# Patient Record
Sex: Female | Born: 2000 | Hispanic: No | Marital: Single
Health system: Southern US, Community
[De-identification: ages and names within clinical notes are randomized; demographics above are authoritative.]

## PROBLEM LIST (undated history)

## (undated) DIAGNOSIS — J45909 Unspecified asthma, uncomplicated: Secondary | ICD-10-CM

---

## 2020-09-24 ENCOUNTER — Ambulatory Visit: Admission: EM | Admit: 2020-09-24 | Discharge: 2020-09-24 | Discharge status: Against Medical Advice | Payer: Self-pay

## 2020-09-24 ENCOUNTER — Emergency Department (HOSPITAL_COMMUNITY): Payer: Medicaid Other

## 2020-09-24 ENCOUNTER — Other Ambulatory Visit: Payer: Self-pay

## 2020-09-24 ENCOUNTER — Emergency Department (HOSPITAL_COMMUNITY)
Admission: EM | Admit: 2020-09-24 | Discharge: 2020-09-24 | Disposition: A | Discharge status: Home / Self Care | Payer: Medicaid Other | Attending: Emergency Medicine | Admitting: Emergency Medicine

## 2020-09-24 ENCOUNTER — Encounter (HOSPITAL_COMMUNITY): Payer: Self-pay | Admitting: Emergency Medicine

## 2020-09-24 DIAGNOSIS — R0789 Other chest pain: Secondary | ICD-10-CM | POA: Insufficient documentation

## 2020-09-24 DIAGNOSIS — J45909 Unspecified asthma, uncomplicated: Secondary | ICD-10-CM | POA: Diagnosis not present

## 2020-09-24 DIAGNOSIS — R059 Cough, unspecified: Secondary | ICD-10-CM | POA: Diagnosis not present

## 2020-09-24 DIAGNOSIS — Z20822 Contact with and (suspected) exposure to covid-19: Secondary | ICD-10-CM | POA: Insufficient documentation

## 2020-09-24 DIAGNOSIS — R509 Fever, unspecified: Secondary | ICD-10-CM | POA: Insufficient documentation

## 2020-09-24 DIAGNOSIS — R000 Tachycardia, unspecified: Secondary | ICD-10-CM | POA: Insufficient documentation

## 2020-09-24 LAB — BASIC METABOLIC PANEL
Anion gap: 12 (ref 5–15)
BUN: 8 mg/dL (ref 6–20)
CO2: 23 mmol/L (ref 22–32)
Calcium: 9.3 mg/dL (ref 8.9–10.3)
Chloride: 102 mmol/L (ref 98–111)
Creatinine, Ser: 0.61 mg/dL (ref 0.44–1.00)
GFR, Estimated: >60 mL/min (ref >60)
Glucose, Bld: 90 mg/dL (ref 70–99)
Potassium: 3.8 mmol/L (ref 3.5–5.1)
Sodium: 137 mmol/L (ref 135–145)

## 2020-09-24 LAB — CBC WITH DIFFERENTIAL/PLATELET
Abs Immature Granulocytes: 0.04 10*3/uL (ref 0.00–0.07)
Basophils Absolute: 0 10*3/uL (ref 0.0–0.1)
Basophils Relative: 0 %
Eosinophils Absolute: 0 10*3/uL (ref 0.0–0.5)
Eosinophils Relative: 0 %
HCT: 42.6 % (ref 36.0–46.0)
Hemoglobin: 13.7 g/dL (ref 12.0–15.0)
Immature Granulocytes: 1 %
Lymphocytes Relative: 19 %
Lymphs Abs: 1.3 10*3/uL (ref 0.7–4.0)
MCH: 30.6 pg (ref 26.0–34.0)
MCHC: 32.2 g/dL (ref 30.0–36.0)
MCV: 95.3 fL (ref 80.0–100.0)
Monocytes Absolute: 0.8 10*3/uL (ref 0.1–1.0)
Monocytes Relative: 12 %
Neutro Abs: 4.7 10*3/uL (ref 1.7–7.7)
Neutrophils Relative %: 68 %
Platelets: 175 10*3/uL (ref 150–400)
RBC: 4.47 MIL/uL (ref 3.87–5.11)
RDW: 14.4 % (ref 11.5–15.5)
WBC: 6.9 10*3/uL (ref 4.0–10.5)
nRBC: 0 % (ref 0.0–0.2)

## 2020-09-24 LAB — D-DIMER, QUANTITATIVE: D-Dimer, Quant: 0.4 ug/mL-FEU (ref 0.00–0.50)

## 2020-09-24 LAB — SARS CORONAVIRUS 2 (TAT 6-24 HRS): SARS Coronavirus 2: NEGATIVE

## 2020-09-24 MED ORDER — ACETAMINOPHEN 325 MG PO TABS
650.0000 mg | ORAL_TABLET | Freq: Once | ORAL | Status: AC
  Administered 2020-09-24: 650 mg via ORAL
  Filled 2020-09-24: qty 2

## 2020-09-24 MED ORDER — BENZONATATE 100 MG PO CAPS
100.0000 mg | ORAL_CAPSULE | Freq: Once | ORAL | Status: AC
  Administered 2020-09-24: 100 mg via ORAL
  Filled 2020-09-24: qty 1

## 2020-09-24 MED ORDER — BENZONATATE 100 MG PO CAPS: 100.0000 mg | ORAL_CAPSULE | Freq: Three times a day (TID) | ORAL | 0 refills | Status: DC | PRN

## 2020-09-24 MED ORDER — IBUPROFEN 800 MG PO TABS
800.0000 mg | ORAL_TABLET | Freq: Once | ORAL | Status: AC
  Administered 2020-09-24: 800 mg via ORAL
  Filled 2020-09-24: qty 1

## 2020-09-24 MED ORDER — ALBUTEROL SULFATE HFA 108 (90 BASE) MCG/ACT IN AERS: 1.0000 | INHALATION_SPRAY | Freq: Four times a day (QID) | RESPIRATORY_TRACT | 0 refills | Status: DC | PRN

## 2020-09-24 NOTE — ED Provider Notes (Signed)
[facility] EMERGENCY DEPARTMENT Provider Note   CSN: [id number] Arrival date & time: 09/24/20  1059     History Chief Complaint  Patient presents with  . Cough  . Chest Pain  . Generalized Body Aches    Karla Torres is a 20 y.o. female.  The history is provided by the patient and medical records.  Cough Associated symptoms: chest pain   Chest Pain Associated symptoms: cough    Karla Torres is a 20 y.o. female who presents to the Emergency Department complaining of cough.  She reports cough and chest discomfort since Thursday.  Cough is productive of mucus.  Had a small amount of hemoptysis today.  Had a fever to 102 today. No vomiting/diarrhea.  Has a hx/o asthma.  Had a sick contact last week - not sure if they had covid or flu.  Has IUD.  No leg swelling/pain.  No hx/o DVT/PE. Symptoms are moderate and constant nature.    History reviewed. No pertinent past medical history.  There are no problems to display for this patient.   History reviewed. No pertinent surgical history.   OB History   No obstetric history on file.     No family history on file.  Social History   Tobacco Use  . Smokeless tobacco: Never Used  Substance Use Topics  . Drug use: Yes    Types: Marijuana    Home Medications Prior to Admission medications   Medication Sig Start Date End Date Taking? Authorizing Provider  albuterol (VENTOLIN HFA) 108 (90 Base) MCG/ACT inhaler Inhale 1-2 puffs into the lungs every 6 (six) hours as needed for wheezing or shortness of breath. 09/24/20  Yes Tilden Fossa, MD  benzonatate (TESSALON) 100 MG capsule Take 1 capsule (100 mg total) by mouth 3 (three) times daily as needed for cough. 09/24/20  Yes Tilden Fossa, MD  levonorgestrel [facility]) 19.5 MG IUD 19.5 mg by Intrauterine route once. Placed: 7/14/21Removal date: 12/22/24 Lot: [id number] date: 04/2022   Yes [provider]    Allergies    Amoxicillin  Review of  Systems   Review of Systems  Respiratory: Positive for cough.   Cardiovascular: Positive for chest pain.  All other systems reviewed and are negative.   Physical Exam Updated Vital Signs BP 99/68 (BP Location: Right Arm)   Pulse 87   Temp 98.4 F (36.9 C) (Axillary)   Resp (!) 21   LMP 09/24/2020   SpO2 95%   Physical Exam Vitals and nursing note reviewed.  Constitutional:      Appearance: She is well-developed.  HENT:     Head: Normocephalic and atraumatic.  Cardiovascular:     Rate and Rhythm: Regular rhythm. Tachycardia present.     Heart sounds: No murmur heard.   Pulmonary:     Effort: Pulmonary effort is normal. No respiratory distress.     Breath sounds: Normal breath sounds.  Abdominal:     Palpations: Abdomen is soft.     Tenderness: There is no abdominal tenderness. There is no guarding or rebound.  Musculoskeletal:        General: No swelling or tenderness.  Skin:    General: Skin is warm and dry.  Neurological:     Mental Status: She is alert and oriented to person, place, and time.  Psychiatric:        Behavior: Behavior normal.     ED Results / Procedures / Treatments   Labs (all labs ordered are listed, but only  abnormal results are displayed) Labs Reviewed  SARS CORONAVIRUS 2 (TAT 6-24 HRS)  CBC WITH DIFFERENTIAL/PLATELET  BASIC METABOLIC PANEL  D-DIMER, QUANTITATIVE  POC URINE PREG, ED    EKG EKG Interpretation  Date/Time:  Saturday September 24 2020 11:29:32 EDT Ventricular Rate:  97 PR Interval:  140 QRS Duration: 82 QT Interval:  336 QTC Calculation: 426 R Axis:   68 Text Interpretation: Normal sinus rhythm Normal ECG Confirmed by Tilden Fossa [phone]) on 09/24/2020 1:19:29 PM   Radiology DG Chest 2 View  Result Date: 09/24/2020 CLINICAL DATA:  Cough,cp,body aches EXAM: CHEST - 2 VIEW COMPARISON:  None. FINDINGS: The cardiomediastinal contours are within normal limits. The lungs are clear. No pneumothorax or pleural effusion. No  acute finding in the visualized skeleton. IMPRESSION: No acute cardiopulmonary process. Electronically Signed   By: Emmaline Kluver M.D.   On: 09/24/2020 15:23    Procedures Procedures   Medications Ordered in ED Medications  acetaminophen (TYLENOL) tablet 650 mg (650 mg Oral Given 09/24/20 1428)  benzonatate (TESSALON) capsule 100 mg (100 mg Oral Given 09/24/20 1428)  ibuprofen (ADVIL) tablet 800 mg (800 mg Oral Given 09/24/20 1712)    ED Course  I have reviewed the triage vital signs and the nursing notes.  Pertinent labs & imaging results that were available during my care of the patient were reviewed by me and considered in my medical decision making (see chart for details).    MDM Rules/Calculators/A&P                         patient here for evaluation of fever, cough, chest discomfort. She is non-toxic appearing on evaluation. Presentation is not consistent with pneumonia, sepsis, serious bacterial infection. Presentation is not consistent with PE, D dimer is negative. Discussed with patient likely viral illness, possible COVID-19 versus influenza versus separate upper respiratory infection. Discussed home care with Tylenol and ibuprofen for fever control as well as antitussives. Discussed outpatient follow-up and return precautions.  Final Clinical Impression(s) / ED Diagnoses Final diagnoses:  Suspected COVID-19 virus infection    Rx / DC Orders ED Discharge Orders         Ordered    benzonatate (TESSALON) 100 MG capsule  3 times daily PRN        09/24/20 1647    albuterol (VENTOLIN HFA) 108 (90 Base) MCG/ACT inhaler  Every 6 hours PRN        09/24/20 1647           Tilden Fossa, MD 09/24/20 1754

## 2020-09-24 NOTE — ED Notes (Signed)
Patient transported to X-ray 

## 2020-09-24 NOTE — ED Triage Notes (Signed)
Pt. Stated, I was exposed to someone who has COVID  And I started having symptoms on Thursday. Cough, chest pressure, generalized aches.

## 2020-11-16 ENCOUNTER — Other Ambulatory Visit: Payer: Self-pay

## 2020-11-16 ENCOUNTER — Emergency Department (HOSPITAL_COMMUNITY)
Admission: EM | Admit: 2020-11-16 | Discharge: 2020-11-16 | Disposition: A | Discharge status: Home / Self Care | Payer: Medicaid Other | Attending: Emergency Medicine | Admitting: Emergency Medicine

## 2020-11-16 DIAGNOSIS — R300 Dysuria: Secondary | ICD-10-CM | POA: Diagnosis not present

## 2020-11-16 DIAGNOSIS — Z113 Encounter for screening for infections with a predominantly sexual mode of transmission: Secondary | ICD-10-CM | POA: Insufficient documentation

## 2020-11-16 DIAGNOSIS — N938 Other specified abnormal uterine and vaginal bleeding: Secondary | ICD-10-CM | POA: Diagnosis present

## 2020-11-16 DIAGNOSIS — Z202 Contact with and (suspected) exposure to infections with a predominantly sexual mode of transmission: Secondary | ICD-10-CM

## 2020-11-16 LAB — URINALYSIS, ROUTINE W REFLEX MICROSCOPIC
Bilirubin Urine: NEGATIVE
Glucose, UA: NEGATIVE mg/dL
Hgb urine dipstick: NEGATIVE
Ketones, ur: 5 mg/dL — AB
Leukocytes,Ua: NEGATIVE
Nitrite: NEGATIVE
Protein, ur: NEGATIVE mg/dL
Specific Gravity, Urine: 1.026 (ref 1.005–1.030)
pH: 6 (ref 5.0–8.0)

## 2020-11-16 LAB — PREGNANCY, URINE: Preg Test, Ur: NEGATIVE

## 2020-11-16 NOTE — Discharge Instructions (Addendum)
You results should be available on myChart in the next 2-3 days.  We will contact you if you have a positive test result and call in an antibiotic for you.

## 2020-11-16 NOTE — ED Provider Notes (Signed)
[facility]-EMERGENCY DEPT Provider Note   CSN: [id number] Arrival date & time: 11/16/20  1920     History Chief Complaint  Patient presents with  . Exposure to STD    Karla Torres is a 20 y.o. female.  Patient presents to the emergency department with a chief complaint of requesting STD testing.  She states that she had some vaginal discharge and dysuria a couple weeks ago.  She reports being intermittently sexually active with multiple partners with intermittent barrier protection use.  She denies any abdominal pain or pelvic pain now.  Denies any discharge currently.  The history is provided by the patient. No language interpreter was used.       No past medical history on file.  There are no problems to display for this patient.   No past surgical history on file.   OB History   No obstetric history on file.     No family history on file.  Social History   Tobacco Use  . Smokeless tobacco: Never Used  Substance Use Topics  . Drug use: Yes    Types: Marijuana    Home Medications Prior to Admission medications   Medication Sig Start Date End Date Taking? Authorizing Provider  albuterol (VENTOLIN HFA) 108 (90 Base) MCG/ACT inhaler Inhale 1-2 puffs into the lungs every 6 (six) hours as needed for wheezing or shortness of breath. 09/24/20   Tilden Fossa, MD  benzonatate (TESSALON) 100 MG capsule Take 1 capsule (100 mg total) by mouth 3 (three) times daily as needed for cough. 09/24/20   Tilden Fossa, MD  levonorgestrel [facility]) 19.5 MG IUD 19.5 mg by Intrauterine route once. Placed: 7/14/21Removal date: 12/22/24 Lot: [id number] date: 04/2022    [provider]    Allergies    Amoxicillin  Review of Systems   Review of Systems  All other systems reviewed and are negative.   Physical Exam Updated Vital Signs BP 134/78   Pulse 70   Temp 98 F (36.7 C) (Oral)   Resp 18   Ht 5\' 3"  (1.6 m)   Wt 121.4 kg   SpO2 100%    BMI 47.41 kg/m   Physical Exam Vitals and nursing note reviewed.  Constitutional:      General: She is not in acute distress.    Appearance: She is well-developed.  HENT:     Head: Normocephalic and atraumatic.  Eyes:     Conjunctiva/sclera: Conjunctivae normal.  Cardiovascular:     Rate and Rhythm: Normal rate.     Heart sounds: No murmur heard.   Pulmonary:     Effort: Pulmonary effort is normal. No respiratory distress.  Abdominal:     General: There is no distension.  Musculoskeletal:     Cervical back: Neck supple.     Comments: Moves all extremities  Skin:    General: Skin is warm and dry.  Neurological:     Mental Status: She is alert and oriented to person, place, and time.  Psychiatric:        Mood and Affect: Mood normal.        Behavior: Behavior normal.     ED Results / Procedures / Treatments   Labs (all labs ordered are listed, but only abnormal results are displayed) Labs Reviewed  URINALYSIS, ROUTINE W REFLEX MICROSCOPIC - Abnormal; Notable for the following components:      Result Value   APPearance HAZY (*)    Ketones, ur 5 (*)  All other components within normal limits  URINE CULTURE  PREGNANCY, URINE    EKG None  Radiology No results found.  Procedures Procedures   Medications Ordered in ED Medications - No data to display  ED Course  I have reviewed the triage vital signs and the nursing notes.  Pertinent labs & imaging results that were available during my care of the patient were reviewed by me and considered in my medical decision making (see chart for details).    MDM Rules/Calculators/A&P                          Patient here with vaginal discharge from a couple of weeks ago.  Denies complaints now, but would like to have STD testing to be certain she doesn't having and infection.  She is non-toxic appearing.  No having any abdominal pain or back pain.  I doubt any serious infection or PID.  Preg test negative.  UA  unremarkable.  Patient agreeable with self swabbing for G/C testing and we will call her with any positive results.  Final Clinical Impression(s) / ED Diagnoses Final diagnoses:  Possible exposure to STD    Rx / DC Orders ED Discharge Orders    None       Roxy Horseman, PA-C 11/16/20 2256    Linwood Dibbles, MD 11/17/20 [id number]

## 2020-11-16 NOTE — ED Triage Notes (Signed)
Pt states she wants to be tested for STD's. Denies vaginal discharge, urinary symptoms or pelvic pain

## 2020-11-16 NOTE — ED Provider Notes (Signed)
Emergency Medicine Provider Triage Evaluation Note  Karla Torres , a 20 y.o. female  was evaluated in triage.  Pt complains of STD check and vaginal discharge.  Patient states 2 weeks ago she had burning with urination and vaginal discharge.  She admits to being sexually active with 4 partners, sometimes uses protection but not always.  Review of Systems  Positive: Vaginal discharge, dysuria Negative: Fever, chills, abdominal pain, nausea, emesis, back pain  Physical Exam  BP 134/78   Pulse 70   Temp 98 F (36.7 C) (Oral)   Resp 18   Ht 5\' 3"  (1.6 m)   Wt 121.4 kg   SpO2 100%   BMI 47.41 kg/m  Gen:   Awake, no distress   Resp:  Normal effort MSK:   Moves extremities without difficulty Other:  No abdominal tenderness.  Medical Decision Making  Medically screening exam initiated at 8:01 PM.  Appropriate orders placed.  Karla Torres was informed that the remainder of the evaluation will be completed by another provider, this initial triage assessment does not replace that evaluation, and the importance of remaining in the ED until their evaluation is complete.  Urinalysis and pregnancy test ordered.    Portions of this note were generated with Mel Almond. Dictation errors may occur despite best attempts at proofreading.    Scientist, clinical (histocompatibility and immunogenetics), PA-C 11/16/20 01/16/21, MD 11/17/20 [phone]

## 2020-11-18 LAB — GC/CHLAMYDIA PROBE AMP (~~LOC~~) NOT AT ARMC
Chlamydia: NEGATIVE
Comment: NEGATIVE
Comment: NORMAL
Neisseria Gonorrhea: NEGATIVE

## 2020-11-18 LAB — URINE CULTURE

## 2020-12-20 ENCOUNTER — Emergency Department (HOSPITAL_COMMUNITY)
Admission: EM | Admit: 2020-12-20 | Discharge: 2020-12-20 | Disposition: A | Discharge status: Home / Self Care | Payer: Medicaid Other | Attending: Emergency Medicine | Admitting: Emergency Medicine

## 2020-12-20 ENCOUNTER — Other Ambulatory Visit: Payer: Self-pay

## 2020-12-20 ENCOUNTER — Encounter (HOSPITAL_COMMUNITY): Payer: Self-pay | Admitting: Emergency Medicine

## 2020-12-20 DIAGNOSIS — R300 Dysuria: Secondary | ICD-10-CM | POA: Diagnosis present

## 2020-12-20 DIAGNOSIS — N898 Other specified noninflammatory disorders of vagina: Secondary | ICD-10-CM | POA: Insufficient documentation

## 2020-12-20 DIAGNOSIS — N390 Urinary tract infection, site not specified: Secondary | ICD-10-CM

## 2020-12-20 LAB — WET PREP, GENITAL
Clue Cells Wet Prep HPF POC: NONE SEEN
Sperm: NONE SEEN
Trich, Wet Prep: NONE SEEN
Yeast Wet Prep HPF POC: NONE SEEN

## 2020-12-20 LAB — URINALYSIS, ROUTINE W REFLEX MICROSCOPIC
Bilirubin Urine: NEGATIVE
Glucose, UA: NEGATIVE mg/dL
Ketones, ur: NEGATIVE mg/dL
Nitrite: NEGATIVE
Protein, ur: 30 mg/dL — AB
Specific Gravity, Urine: 1.016 (ref 1.005–1.030)
WBC, UA: >50 WBC/hpf — ABNORMAL HIGH (ref 0–5)
pH: 6 (ref 5.0–8.0)

## 2020-12-20 LAB — POC URINE PREG, ED: Preg Test, Ur: NEGATIVE

## 2020-12-20 MED ORDER — SULFAMETHOXAZOLE-TRIMETHOPRIM 800-160 MG PO TABS: 1.0000 | ORAL_TABLET | Freq: Two times a day (BID) | ORAL | 0 refills | Status: AC

## 2020-12-20 NOTE — Discharge Instructions (Addendum)
Overall it appears that you have a urinary tract infection.  Take antibiotic as prescribed.

## 2020-12-20 NOTE — ED Provider Notes (Signed)
[facility]-EMERGENCY DEPT Provider Note   CSN: [id number] Arrival date & time: 12/20/20  0913     History Chief Complaint  Patient presents with   Dysuria    Karla Torres is a 20 y.o. female.  The history is provided by the patient.  Dysuria Quality: frequency. Pain severity:  Mild Onset quality:  Gradual Timing:  Constant Progression:  Unchanged Chronicity:  New Relieved by:  Nothing Worsened by:  Nothing Urinary symptoms: frequent urination   Urinary symptoms comment:  Concerned for possible pregnancty Associated symptoms: vaginal discharge   Associated symptoms: no abdominal pain, no fever, no flank pain and no nausea       History reviewed. No pertinent past medical history.  There are no problems to display for this patient.   History reviewed. No pertinent surgical history.   OB History   No obstetric history on file.     No family history on file.  Social History   Tobacco Use   Smokeless tobacco: Never  Substance Use Topics   Drug use: Yes    Types: Marijuana    Home Medications Prior to Admission medications   Medication Sig Start Date End Date Taking? Authorizing Provider  sulfamethoxazole-trimethoprim (BACTRIM DS) 800-160 MG tablet Take 1 tablet by mouth 2 (two) times daily for 5 days. 12/20/20 12/25/20 Yes Yaira Bernardi, DO  albuterol (VENTOLIN HFA) 108 (90 Base) MCG/ACT inhaler Inhale 1-2 puffs into the lungs every 6 (six) hours as needed for wheezing or shortness of breath. 09/24/20   Tilden Fossa, MD  benzonatate (TESSALON) 100 MG capsule Take 1 capsule (100 mg total) by mouth 3 (three) times daily as needed for cough. 09/24/20   Tilden Fossa, MD  levonorgestrel [facility]) 19.5 MG IUD 19.5 mg by Intrauterine route once. Placed: 12/23/19  Removal date: 12/22/24 Lot: [id number]  Exp date: 04/2022    [provider]    Allergies    Amoxicillin  Review of Systems   Review of Systems  Constitutional:  Negative  for fever.  Gastrointestinal:  Negative for abdominal pain and nausea.  Genitourinary:  Positive for dysuria and vaginal discharge. Negative for decreased urine volume, difficulty urinating, flank pain, hematuria, menstrual problem, pelvic pain and vaginal bleeding.   Physical Exam Updated Vital Signs BP (!) 143/86 (BP Location: Right Arm)   Pulse 75   Temp 98.4 F (36.9 C) (Oral)   Resp 17   SpO2 95%   Physical Exam Cardiovascular:     Pulses: Normal pulses.  Pulmonary:     Effort: Pulmonary effort is normal.  Abdominal:     General: Abdomen is flat.     Tenderness: There is no abdominal tenderness. There is no guarding or rebound.     Hernia: No hernia is present.  Neurological:     Mental Status: She is alert.    ED Results / Procedures / Treatments   Labs (all labs ordered are listed, but only abnormal results are displayed) Labs Reviewed  WET PREP, GENITAL - Abnormal; Notable for the following components:      Result Value   WBC, Wet Prep HPF POC FEW (*)    All other components within normal limits  URINALYSIS, ROUTINE W REFLEX MICROSCOPIC - Abnormal; Notable for the following components:   APPearance CLOUDY (*)    Hgb urine dipstick MODERATE (*)    Protein, ur 30 (*)    Leukocytes,Ua LARGE (*)    WBC, UA >50 (*)    Bacteria, UA RARE (*)  Non Squamous Epithelial 0-5 (*)    All other components within normal limits  POC URINE PREG, ED  GC/CHLAMYDIA PROBE AMP ([place]) NOT AT [facility]    EKG None  Radiology No results found.  Procedures Procedures   Medications Ordered in ED Medications - No data to display  ED Course  I have reviewed the triage vital signs and the nursing notes.  Pertinent labs & imaging results that were available during my care of the patient were reviewed by me and considered in my medical decision making (see chart for details).    MDM Rules/Calculators/A&P                          Karla Torres is here with urinary  frequency.  Urinalysis consistent with UTI.  Will treat with antibiotics.  Pelvic exam was done by my PA which is unremarkable.  Wet prep unremarkable.  No concern for STD per patient.  Pregnancy test negative.  No concern for sepsis.  Discharged in good condition.  This chart was dictated using voice recognition software.  Despite best efforts to proofread,  errors can occur which can change the documentation meaning.     Rx / DC Orders ED Discharge Orders          Ordered    sulfamethoxazole-trimethoprim (BACTRIM DS) 800-160 MG tablet  2 times daily        12/20/20 1123             Theresea Trautmann, Madelaine Bhat, DO 12/20/20 1124

## 2020-12-20 NOTE — ED Triage Notes (Signed)
Per pt, states urinary frequency for a couple of days-thinks she might have a UTI

## 2020-12-21 LAB — GC/CHLAMYDIA PROBE AMP (~~LOC~~) NOT AT ARMC
Chlamydia: NEGATIVE
Comment: NEGATIVE
Comment: NORMAL
Neisseria Gonorrhea: NEGATIVE

## 2020-12-21 LAB — URINE CULTURE

## 2021-05-03 ENCOUNTER — Other Ambulatory Visit: Payer: Self-pay

## 2021-05-03 ENCOUNTER — Encounter (HOSPITAL_COMMUNITY): Payer: Self-pay | Admitting: Emergency Medicine

## 2021-05-03 ENCOUNTER — Emergency Department (HOSPITAL_COMMUNITY)
Admission: EM | Admit: 2021-05-03 | Discharge: 2021-05-03 | Disposition: A | Discharge status: Home / Self Care | Payer: Medicaid Other | Attending: Emergency Medicine | Admitting: Emergency Medicine

## 2021-05-03 DIAGNOSIS — Z202 Contact with and (suspected) exposure to infections with a predominantly sexual mode of transmission: Secondary | ICD-10-CM | POA: Diagnosis present

## 2021-05-03 LAB — WET PREP, GENITAL
Clue Cells Wet Prep HPF POC: NONE SEEN
Clue Cells Wet Prep HPF POC: NONE SEEN
Sperm: NONE SEEN
Sperm: NONE SEEN
Trich, Wet Prep: NONE SEEN
Trich, Wet Prep: NONE SEEN
WBC, Wet Prep HPF POC: <10 (ref <10)
WBC, Wet Prep HPF POC: <10 (ref <10)
Yeast Wet Prep HPF POC: NONE SEEN
Yeast Wet Prep HPF POC: NONE SEEN

## 2021-05-03 LAB — PREGNANCY, URINE: Preg Test, Ur: NEGATIVE

## 2021-05-03 LAB — HIV ANTIBODY (ROUTINE TESTING W REFLEX): HIV Screen 4th Generation wRfx: NONREACTIVE

## 2021-05-03 MED ORDER — CEFTRIAXONE SODIUM 500 MG IJ SOLR
500.0000 mg | Freq: Once | INTRAMUSCULAR | Status: AC
  Administered 2021-05-03: 500 mg via INTRAMUSCULAR
  Filled 2021-05-03: qty 500

## 2021-05-03 MED ORDER — DOXYCYCLINE HYCLATE 100 MG PO TABS
100.0000 mg | ORAL_TABLET | Freq: Once | ORAL | Status: AC
  Administered 2021-05-03: 100 mg via ORAL
  Filled 2021-05-03: qty 1

## 2021-05-03 MED ORDER — LIDOCAINE HCL (PF) 1 % IJ SOLN
INTRAMUSCULAR | Status: AC
  Administered 2021-05-03: 5 mL
  Filled 2021-05-03: qty 5

## 2021-05-03 MED ORDER — DOXYCYCLINE HYCLATE 100 MG PO CAPS: 100.0000 mg | ORAL_CAPSULE | Freq: Two times a day (BID) | ORAL | 0 refills | Status: DC

## 2021-05-03 NOTE — ED Provider Notes (Signed)
[facility] EMERGENCY DEPARTMENT Provider Note   CSN: [id number] Arrival date & time: 05/03/21  1413     History Chief Complaint  Patient presents with   Exposure to STD    Karla Torres is a 20 y.o. female.   Exposure to STD Pertinent negatives include no chest pain, no abdominal pain and no shortness of breath.   20 year old female presenting to the emergency department for an STD check.  She states that she has had a mild amount of vaginal discharge.  She states that her sexual partner has been with another partner and she would like to be evaluated.  She denies any genital lesions.  She denies any pelvic pain or discomfort, fevers or chills or any other symptoms.  History reviewed. No pertinent past medical history.  There are no problems to display for this patient.   History reviewed. No pertinent surgical history.   OB History   No obstetric history on file.     No family history on file.  Social History   Tobacco Use   Smokeless tobacco: Never  Substance Use Topics   Drug use: Yes    Types: Marijuana    Home Medications Prior to Admission medications   Medication Sig Start Date End Date Taking? Authorizing Provider  doxycycline (VIBRAMYCIN) 100 MG capsule Take 1 capsule (100 mg total) by mouth 2 (two) times daily. 05/03/21  Yes Ernie Avena, MD  albuterol (VENTOLIN HFA) 108 (90 Base) MCG/ACT inhaler Inhale 1-2 puffs into the lungs every 6 (six) hours as needed for wheezing or shortness of breath. 09/24/20   Tilden Fossa, MD  benzonatate (TESSALON) 100 MG capsule Take 1 capsule (100 mg total) by mouth 3 (three) times daily as needed for cough. 09/24/20   Tilden Fossa, MD  levonorgestrel [facility]) 19.5 MG IUD 19.5 mg by Intrauterine route once. Placed: 12/23/19  Removal date: 12/22/24 Lot: [id number]  Exp date: 04/2022    [provider]    Allergies    Amoxicillin  Review of Systems   Review of Systems  Constitutional:   Negative for chills and fever.  HENT:  Negative for ear pain and sore throat.   Eyes:  Negative for pain and visual disturbance.  Respiratory:  Negative for cough and shortness of breath.   Cardiovascular:  Negative for chest pain and palpitations.  Gastrointestinal:  Negative for abdominal pain and vomiting.  Genitourinary:  Positive for vaginal discharge. Negative for dysuria and hematuria.  Musculoskeletal:  Negative for arthralgias and back pain.  Skin:  Negative for color change and rash.  Neurological:  Negative for seizures and syncope.  All other systems reviewed and are negative.  Physical Exam Updated Vital Signs BP 128/72 (BP Location: Left Arm)   Pulse 72   Temp 98.3 F (36.8 C) (Oral)   Resp 16   SpO2 99%   Physical Exam Vitals and nursing note reviewed. Exam conducted with a chaperone present.  Constitutional:      General: She is not in acute distress.    Appearance: She is well-developed.  HENT:     Head: Normocephalic and atraumatic.  Eyes:     Conjunctiva/sclera: Conjunctivae normal.  Cardiovascular:     Rate and Rhythm: Normal rate and regular rhythm.     Heart sounds: No murmur heard. Pulmonary:     Effort: Pulmonary effort is normal. No respiratory distress.     Breath sounds: Normal breath sounds.  Abdominal:     Palpations: Abdomen is soft.  Tenderness: There is no abdominal tenderness.  Genitourinary:    Vagina: Normal.     Cervix: No cervical motion tenderness, friability, erythema or cervical bleeding.     Uterus: Normal. Not tender.      Adnexa: Right adnexa normal and left adnexa normal.       Right: No tenderness.         Left: No tenderness.    Musculoskeletal:        General: No swelling.     Cervical back: Neck supple.  Skin:    General: Skin is warm and dry.     Capillary Refill: Capillary refill takes less than 2 seconds.  Neurological:     Mental Status: She is alert.  Psychiatric:        Mood and Affect: Mood normal.     ED Results / Procedures / Treatments   Labs (all labs ordered are listed, but only abnormal results are displayed) Labs Reviewed  WET PREP, GENITAL  WET PREP, GENITAL  PREGNANCY, URINE  HIV ANTIBODY (ROUTINE TESTING W REFLEX)  RPR  GC/CHLAMYDIA PROBE AMP () NOT AT [facility]    EKG None  Radiology No results found.  Procedures Procedures   Medications Ordered in ED Medications  cefTRIAXone (ROCEPHIN) injection 500 mg (500 mg Intramuscular Given 05/03/21 1756)  doxycycline (VIBRA-TABS) tablet 100 mg (100 mg Oral Given 05/03/21 1756)  lidocaine (PF) (XYLOCAINE) 1 % injection (5 mLs  Given 05/03/21 1756)    ED Course  I have reviewed the triage vital signs and the nursing notes.  Pertinent labs & imaging results that were available during my care of the patient were reviewed by me and considered in my medical decision making (see chart for details).    MDM Rules/Calculators/A&P                           38 old female presenting to the emergency department for STI testing.  Urine pregnancy negative, HIV nonreactive.  Exam performed and negative for cervical motion tenderness, adnexal tenderness, uterine tenderness.  No vaginal bleeding or bleeding from the cervix noted.  No friability or cervical motion tenderness.  Patient does request empiric treatment for STIs given her potential exposure.  GC/chlamydia testing collected and pending.  The patient was empirically covered for STIs with 500 mg of IM Rocephin and doxycycline p.o.  A prescription for doxycycline was provided.  Her wet prep resulted negative.  Overall stable for discharge  Final Clinical Impression(s) / ED Diagnoses Final diagnoses:  Possible exposure to STD    Rx / DC Orders ED Discharge Orders          Ordered    doxycycline (VIBRAMYCIN) 100 MG capsule  2 times daily        05/03/21 1859             Regan Lemming, MD 05/03/21 1900

## 2021-05-03 NOTE — ED Triage Notes (Signed)
Patient here for STD check. Denies any symptoms. Reports sexual partner has been with another partner and would like to be evaluated.

## 2021-05-03 NOTE — Discharge Instructions (Signed)
You were evaluated in the Emergency Department and after careful evaluation, we did not find any emergent condition requiring admission or further testing in the hospital.  Your exam/testing today was overall reassuring.  Your GC/chlamydia testing is pending and will likely be resulted in the next day.  We have prophylactically covered you for STIs at your request.  Please return to the Emergency Department if you experience any worsening of your condition.  Thank you for allowing [place] to be a part of your care.

## 2021-05-03 NOTE — ED Notes (Signed)
Patient verbalizes understanding of discharge instructions. Prescriptions reviewed. Opportunity for questioning and answers were provided. Armband removed by staff, pt discharged from ED ambulatory. ° °

## 2021-05-04 LAB — RPR: RPR Ser Ql: NONREACTIVE

## 2021-05-05 LAB — GC/CHLAMYDIA PROBE AMP (~~LOC~~) NOT AT ARMC
Chlamydia: NEGATIVE
Comment: NEGATIVE
Comment: NORMAL
Neisseria Gonorrhea: NEGATIVE

## 2021-05-16 ENCOUNTER — Other Ambulatory Visit: Payer: Self-pay

## 2021-05-16 ENCOUNTER — Emergency Department (HOSPITAL_COMMUNITY)
Admission: EM | Admit: 2021-05-16 | Discharge: 2021-05-16 | Disposition: A | Discharge status: Home / Self Care | Payer: Medicaid Other | Attending: Emergency Medicine | Admitting: Emergency Medicine

## 2021-05-16 ENCOUNTER — Encounter (HOSPITAL_COMMUNITY): Payer: Self-pay

## 2021-05-16 DIAGNOSIS — H5789 Other specified disorders of eye and adnexa: Secondary | ICD-10-CM | POA: Diagnosis present

## 2021-05-16 DIAGNOSIS — H1013 Acute atopic conjunctivitis, bilateral: Secondary | ICD-10-CM

## 2021-05-16 MED ORDER — CETIRIZINE HCL 10 MG PO TABS: 10.0000 mg | ORAL_TABLET | Freq: Every day | ORAL | 2 refills | Status: AC

## 2021-05-16 MED ORDER — OLOPATADINE HCL 0.1 % OP SOLN: 1.0000 [drp] | Freq: Two times a day (BID) | OPHTHALMIC | 12 refills | Status: AC

## 2021-05-16 NOTE — ED Triage Notes (Signed)
Pt reports bilateral eye redness and itching x3 days. She reports thinking she might have pink eye.

## 2021-05-16 NOTE — Discharge Instructions (Addendum)
Use Patanol drops twice daily. 1 drop in both eye twice daily.  Take cetirizine as prescribed once daily. Follow up with PCP - information above. Return if painful eye movements, loss of vision, loss of vision or if things worsen.

## 2021-05-16 NOTE — ED Provider Notes (Signed)
[facility]   CSN: [id number] Arrival date & time: 05/16/21  1155     History Chief Complaint  Patient presents with   Eye Problem    Karla Torres is a 20 y.o. female.   Eye Problem Associated symptoms: itching and redness   Associated symptoms: no headaches and no photophobia    Patient with history of of seasonal and environmental allergies presents with bilateral eye redness.  Started 3 days ago, its been constant.  It is bilateral, associated with itching and watery discharge.  Worsens throughout the day, denies any purulent discharge or waking up with her eyes crusted together.  This point there is some crusting of the lid margins when she first wakes up.  There is some pain to her eyes, its not worsened with movement. The eye pain is worse when she rubs that arise. No photophobia, no headache.  No vision loss, she does not wear contacts.  She has not tried any alleviating medicines or factors, wrapping is an aggravating factors.  She reports associated nasal congestion which is worsened in the last week.  She also has a viral bronchitis which - she  is using an inhaler which was prescribed by urgent care.  Reports she has not taken her cetirizine in a "long time".  She also has a cat at home who she is allergic to.  History reviewed. No pertinent past medical history.  There are no problems to display for this patient.   History reviewed. No pertinent surgical history.   OB History   No obstetric history on file.     History reviewed. No pertinent family history.  Social History   Tobacco Use   Smokeless tobacco: Never  Substance Use Topics   Drug use: Yes    Types: Marijuana    Home Medications Prior to Admission medications   Medication Sig Start Date End Date Taking? Authorizing Provider  albuterol (VENTOLIN HFA) 108 (90 Base) MCG/ACT inhaler Inhale 1-2 puffs into the lungs every 6 (six) hours as needed for  wheezing or shortness of breath. 09/24/20   Tilden Fossa, MD  benzonatate (TESSALON) 100 MG capsule Take 1 capsule (100 mg total) by mouth 3 (three) times daily as needed for cough. 09/24/20   Tilden Fossa, MD  doxycycline (VIBRAMYCIN) 100 MG capsule Take 1 capsule (100 mg total) by mouth 2 (two) times daily. 05/03/21   Ernie Avena, MD  levonorgestrel (KYLEENA) 19.5 MG IUD 19.5 mg by Intrauterine route once. Placed: 12/23/19  Removal date: 12/22/24 Lot: [id number]  Exp date: 04/2022    [provider]    Allergies    Amoxicillin  Review of Systems   Review of Systems  Constitutional:  Negative for fever.  HENT:  Positive for congestion and postnasal drip.   Eyes:  Positive for pain, redness and itching. Negative for photophobia.  Neurological:  Negative for dizziness and headaches.   Physical Exam Updated Vital Signs BP 103/78 (BP Location: Left Arm)   Pulse 77   Temp 97.8 F (36.6 C) (Oral)   Resp 17   SpO2 97%   Physical Exam Vitals and nursing note reviewed. Exam conducted with a chaperone present.  Constitutional:      General: She is not in acute distress.    Appearance: Normal appearance.  HENT:     Head: Normocephalic and atraumatic.     Nose: Congestion present.  Eyes:     General: No scleral icterus.    Extraocular  Movements: Extraocular movements intact.     Pupils: Pupils are equal, round, and reactive to light.     Comments: EOMI, no pain with EOM.  No nystagmus.  Pupils equal and laterally.  There is some lower eyelid edema and mildly injected conjunctive a bilaterally.  Watery discharge.  Skin:    Coloration: Skin is not jaundiced.  Neurological:     Mental Status: She is alert. Mental status is at baseline.     Coordination: Coordination normal.    ED Results / Procedures / Treatments   Labs (all labs ordered are listed, but only abnormal results are displayed) Labs Reviewed - No data to display  EKG None  Radiology No results  found.  Procedures Procedures   Medications Ordered in ED Medications - No data to display  ED Course  I have reviewed the triage vital signs and the nursing notes.  Pertinent labs & imaging results that were available during my care of the patient were reviewed by me and considered in my medical decision making (see chart for details).    MDM Rules/Calculators/A&P                           Stable vitals, patient is nontoxic-appearing.  Her physical exam and history are most consistent with allergic conjunctivitis.  EOMI, intact visual acuity.  Viral conjunctivitis is a consideration given her recent prolonged colitis,.  Physical exam does not sound consistent with bacterial conjunctivitis.  No significant pain with eye movement or significant swelling concerning for any orbital or periorbital cellulitis.  Doubt iritis or keratitis.  Age makes glaucoma unlikely, also not consistent with presentation.  Will prescribe cetirizine and olopatadine per follow-up with ophthalmologist in a week or so.  Return precautions given including loss of vision, pain with eye movement, vision changes.  Also referral made for a primary care physician in the area.  Patient discharged in stable condition.  Final Clinical Impression(s) / ED Diagnoses Final diagnoses:  None    Rx / DC Orders ED Discharge Orders     None        Sherrill Raring, Hershal Coria 05/16/21 1612    Carmin Muskrat, MD 05/16/21 2308

## 2021-05-21 ENCOUNTER — Other Ambulatory Visit: Payer: Self-pay

## 2021-05-21 ENCOUNTER — Emergency Department (HOSPITAL_COMMUNITY): Payer: Medicaid Other

## 2021-05-21 ENCOUNTER — Emergency Department (HOSPITAL_COMMUNITY)
Admission: EM | Admit: 2021-05-21 | Discharge: 2021-05-22 | Disposition: A | Discharge status: Home / Self Care | Payer: Medicaid Other | Attending: Emergency Medicine | Admitting: Emergency Medicine

## 2021-05-21 DIAGNOSIS — T07XXXA Unspecified multiple injuries, initial encounter: Secondary | ICD-10-CM

## 2021-05-21 DIAGNOSIS — S80212A Abrasion, left knee, initial encounter: Secondary | ICD-10-CM | POA: Diagnosis not present

## 2021-05-21 DIAGNOSIS — S80211A Abrasion, right knee, initial encounter: Secondary | ICD-10-CM | POA: Diagnosis not present

## 2021-05-21 DIAGNOSIS — M25561 Pain in right knee: Secondary | ICD-10-CM | POA: Diagnosis not present

## 2021-05-21 DIAGNOSIS — W010XXA Fall on same level from slipping, tripping and stumbling without subsequent striking against object, initial encounter: Secondary | ICD-10-CM | POA: Diagnosis not present

## 2021-05-21 DIAGNOSIS — Y9302 Activity, running: Secondary | ICD-10-CM | POA: Diagnosis not present

## 2021-05-21 DIAGNOSIS — S8991XA Unspecified injury of right lower leg, initial encounter: Secondary | ICD-10-CM | POA: Diagnosis present

## 2021-05-21 NOTE — ED Triage Notes (Signed)
Pt fell on both knees while chasing her cat tonight and injured her right knee. Pt has scrapes to both knees.

## 2021-05-22 MED ORDER — NAPROXEN 500 MG PO TABS
500.0000 mg | ORAL_TABLET | Freq: Once | ORAL | Status: AC
  Administered 2021-05-22: 500 mg via ORAL
  Filled 2021-05-22: qty 1

## 2021-05-22 NOTE — ED Provider Notes (Signed)
[facility] Provider Note   CSN: [id number] Arrival date & time: 05/21/21  2301     History Chief Complaint  Patient presents with   Knee Pain   Fall    Karla Torres is a 20 y.o. female.  Chasing her cat outside when she fell causing knee pain. Took ibuprofen around 1800 w/some relief of pain. Pain worse with weight bearing.  The history is provided by the patient. No language interpreter was used.  Knee Pain Location:  Knee Time since incident:  7 hours Injury: yes   Mechanism of injury: fall   Fall:    Height of fall:  From standing/while running   Point of impact:  Knees   Entrapped after fall: no   Knee location:  R knee Pain details:    Quality:  Aching   Radiates to:  Does not radiate   Severity:  Moderate   Onset quality:  Sudden   Duration:  7 hours   Timing:  Constant   Progression:  Waxing and waning Chronicity:  New Dislocation: no   Relieved by:  NSAIDs Worsened by:  Bearing weight and activity Associated symptoms: no decreased ROM, no fever and no numbness   Associated symptoms comment:  Abrasions  Risk factors: obesity   Fall      No past medical history on file.  There are no problems to display for this patient.   No past surgical history on file.   OB History   No obstetric history on file.     No family history on file.  Social History   Tobacco Use   Smokeless tobacco: Never  Substance Use Topics   Drug use: Yes    Types: Marijuana    Home Medications Prior to Admission medications   Medication Sig Start Date End Date Taking? Authorizing Provider  albuterol (VENTOLIN HFA) 108 (90 Base) MCG/ACT inhaler Inhale 1-2 puffs into the lungs every 6 (six) hours as needed for wheezing or shortness of breath. 09/24/20   Tilden Fossa, MD  benzonatate (TESSALON) 100 MG capsule Take 1 capsule (100 mg total) by mouth 3 (three) times daily as needed for cough. 09/24/20   Tilden Fossa, MD   cetirizine (ZYRTEC) 10 MG tablet Take 1 tablet (10 mg total) by mouth daily. 05/16/21   Theron Arista, PA-C  doxycycline (VIBRAMYCIN) 100 MG capsule Take 1 capsule (100 mg total) by mouth 2 (two) times daily. 05/03/21   Ernie Avena, MD  levonorgestrel (KYLEENA) 19.5 MG IUD 19.5 mg by Intrauterine route once. Placed: 12/23/19  Removal date: 12/22/24 Lot: [id number]  Exp date: 04/2022    [provider]  olopatadine (PATANOL) 0.1 % ophthalmic solution Place 1 drop into both eyes 2 (two) times daily. 05/16/21   Theron Arista, PA-C    Allergies    Amoxicillin  Review of Systems   Review of Systems  Constitutional:  Negative for fever.  Musculoskeletal:  Positive for arthralgias.  Skin:  Positive for wound (abrasions).  Ten systems reviewed and are negative for acute change, except as noted in the HPI.    Physical Exam Updated Vital Signs BP (!) 150/119 (BP Location: Right Arm)   Pulse 63   Temp 98.1 F (36.7 C) (Oral)   Resp 16   Ht 5\' 3"  (1.6 m)   Wt 122.5 kg   SpO2 97%   BMI 47.83 kg/m   Physical Exam Vitals and nursing note reviewed.  Constitutional:      General: She  is not in acute distress.    Appearance: She is well-developed. She is not diaphoretic.     Comments: Nontoxic appearing and in NAD  HENT:     Head: Normocephalic and atraumatic.  Eyes:     General: No scleral icterus.    Conjunctiva/sclera: Conjunctivae normal.  Cardiovascular:     Rate and Rhythm: Normal rate and regular rhythm.     Pulses: Normal pulses.     Comments: DP pulse 2+ in the right lower extremity Pulmonary:     Effort: Pulmonary effort is normal. No respiratory distress.  Musculoskeletal:        General: Normal range of motion.     Cervical back: Normal range of motion.     Right knee: No deformity or effusion. Normal range of motion. Tenderness present. Normal pulse.       Legs:     Comments: Full active and passive range of motion of the right knee with flexion and extension.  No  crepitus or deformity noted.  Abrasions overlying bilateral patella.  Skin:    General: Skin is warm and dry.     Coloration: Skin is not pale.     Findings: No rash.  Neurological:     Mental Status: She is alert and oriented to person, place, and time.     Coordination: Coordination normal.     Comments: Sensation to light touch intact in the right lower extremity.  Psychiatric:        Behavior: Behavior normal.    ED Results / Procedures / Treatments   Labs (all labs ordered are listed, but only abnormal results are displayed) Labs Reviewed - No data to display  EKG None  Radiology DG Knee Complete 4 Views Right  Result Date: 05/21/2021 CLINICAL DATA:  Fall with injured right knee. EXAM: RIGHT KNEE - COMPLETE 4+ VIEW COMPARISON:  None. FINDINGS: No evidence of fracture, dislocation, or joint effusion. No evidence of arthropathy or other focal bone abnormality. Soft tissues are unremarkable. IMPRESSION: Negative. Electronically Signed   By: Thornell Sartorius M.D.   On: 05/21/2021 23:53    Procedures Procedures   Medications Ordered in ED Medications  naproxen (NAPROSYN) tablet 500 mg (has no administration in time range)    ED Course  I have reviewed the triage vital signs and the nursing notes.  Pertinent labs & imaging results that were available during my care of the patient were reviewed by me and considered in my medical decision making (see chart for details).    MDM Rules/Calculators/A&P                           Patient presents to the emergency department for evaluation of R knee pain. Patient neurovascularly intact on exam. Imaging negative for fracture, dislocation, bony deformity. No swelling, heat to touch to the affected area; no concern for septic joint. Compartments in the affected extremity are soft. Plan for supportive management including RICE and NSAIDs; primary care follow up as needed. Return precautions discussed and provided. Patient discharged in  stable condition with no unaddressed concerns.   Final Clinical Impression(s) / ED Diagnoses Final diagnoses:  Acute pain of right knee  Multiple abrasions    Rx / DC Orders ED Discharge Orders     None        Antony Madura, PA-C 05/22/21 0050    Zadie Rhine, MD 05/22/21 [phone]

## 2021-06-29 ENCOUNTER — Encounter (HOSPITAL_COMMUNITY): Payer: Self-pay | Admitting: Emergency Medicine

## 2021-06-29 ENCOUNTER — Other Ambulatory Visit: Payer: Self-pay

## 2021-06-29 ENCOUNTER — Emergency Department (HOSPITAL_COMMUNITY)
Admission: EM | Admit: 2021-06-29 | Discharge: 2021-06-29 | Disposition: A | Discharge status: Home / Self Care | Payer: Medicaid Other | Attending: Emergency Medicine | Admitting: Emergency Medicine

## 2021-06-29 DIAGNOSIS — Z20822 Contact with and (suspected) exposure to covid-19: Secondary | ICD-10-CM | POA: Diagnosis not present

## 2021-06-29 DIAGNOSIS — J029 Acute pharyngitis, unspecified: Secondary | ICD-10-CM | POA: Diagnosis not present

## 2021-06-29 HISTORY — DX: Unspecified asthma, uncomplicated: J45.909

## 2021-06-29 LAB — RESP PANEL BY RT-PCR (FLU A&B, COVID) ARPGX2
Influenza A by PCR: NEGATIVE
Influenza B by PCR: NEGATIVE
SARS Coronavirus 2 by RT PCR: NEGATIVE

## 2021-06-29 LAB — GROUP A STREP BY PCR: Group A Strep by PCR: NOT DETECTED

## 2021-06-29 MED ORDER — DEXAMETHASONE 1 MG/ML PO CONC
10.0000 mg | Freq: Once | ORAL | Status: AC
Start: 2021-06-29 — End: 2021-06-29
  Administered 2021-06-29: 10 mg via ORAL
  Filled 2021-06-29: qty 10

## 2021-06-29 NOTE — ED Triage Notes (Signed)
Patient complains of a sore throat x2 days, states she woke up from her nap because she had trouble breathing. Tonsils are swollen.

## 2021-06-29 NOTE — ED Provider Notes (Signed)
[facility] Provider Note   CSN: [id number] Arrival date & time: 06/29/21  1832     History Chief Complaint  Patient presents with   Sore Throat    Karla Torres is a 21 y.o. female. Sore throat for three days. No cough or congestion. No fever. Tolerating secretions.    Sore Throat      Home Medications Prior to Admission medications   Medication Sig Start Date End Date Taking? Authorizing Provider  albuterol (VENTOLIN HFA) 108 (90 Base) MCG/ACT inhaler Inhale 1-2 puffs into the lungs every 6 (six) hours as needed for wheezing or shortness of breath. 09/24/20   Tilden Fossa, MD  benzonatate (TESSALON) 100 MG capsule Take 1 capsule (100 mg total) by mouth 3 (three) times daily as needed for cough. 09/24/20   Tilden Fossa, MD  cetirizine (ZYRTEC) 10 MG tablet Take 1 tablet (10 mg total) by mouth daily. 05/16/21   Theron Arista, PA-C  doxycycline (VIBRAMYCIN) 100 MG capsule Take 1 capsule (100 mg total) by mouth 2 (two) times daily. 05/03/21   Ernie Avena, MD  levonorgestrel (KYLEENA) 19.5 MG IUD 19.5 mg by Intrauterine route once. Placed: 12/23/19  Removal date: 12/22/24 Lot: [id number]  Exp date: 04/2022    [provider]  olopatadine (PATANOL) 0.1 % ophthalmic solution Place 1 drop into both eyes 2 (two) times daily. 05/16/21   Theron Arista, PA-C      Allergies    Amoxicillin    Review of Systems   Review of Systems  HENT:  Positive for sore throat.    Physical Exam Updated Vital Signs BP (!) 128/99    Pulse 79    Temp 98.8 F (37.1 C) (Oral)    Resp 18    SpO2 98%  Physical Exam Vitals and nursing note reviewed.  Constitutional:      General: She is not in acute distress.    Appearance: She is not toxic-appearing.  HENT:     Head: Normocephalic and atraumatic.     Right Ear: Tympanic membrane and ear canal normal. No drainage, swelling or tenderness. No middle ear effusion. Tympanic membrane is not erythematous.     Left  Ear: Tympanic membrane and ear canal normal. No drainage, swelling or tenderness.  No middle ear effusion. Tympanic membrane is not erythematous.     Nose: No congestion or rhinorrhea.     Mouth/Throat:     Mouth: Mucous membranes are moist. No oral lesions.     Pharynx: Oropharynx is clear. Uvula midline. Posterior oropharyngeal erythema present. No pharyngeal swelling, oropharyngeal exudate or uvula swelling.     Tonsils: No tonsillar exudate or tonsillar abscesses. 2+ on the right. 2+ on the left.  Eyes:     General: No scleral icterus.       Right eye: No discharge.        Left eye: No discharge.  Cardiovascular:     Rate and Rhythm: Normal rate and regular rhythm.     Heart sounds: Normal heart sounds. No murmur heard.   No friction rub. No gallop.  Pulmonary:     Effort: Pulmonary effort is normal. No respiratory distress.     Breath sounds: Normal breath sounds. No stridor. No wheezing, rhonchi or rales.  Chest:     Chest wall: No tenderness.  Lymphadenopathy:     Cervical: No cervical adenopathy.  Neurological:     Mental Status: She is alert.  Psychiatric:  Mood and Affect: Mood normal.        Behavior: Behavior normal.    ED Results / Procedures / Treatments   Labs (all labs ordered are listed, but only abnormal results are displayed) Labs Reviewed  GROUP A STREP BY PCR  RESP PANEL BY RT-PCR (FLU A&B, COVID) [id number]    EKG None  Radiology No results found.  Procedures Procedures    Medications Ordered in ED Medications  dexamethasone (DECADRON) 1 MG/ML solution 10 mg (10 mg Oral Given 06/29/21 2051)    ED Course/ Medical Decision Making/ A&P                           Medical Decision Making Problems Addressed: Viral pharyngitis: self-limited or minor problem  Risk Prescription drug management.   Exam not concerning for PTA.  There is pretty significant bilateral tonsillar swelling but no uvular deviation.  There is not a ton of exudates on  the tonsils. Vitals are stable.  Not really have high concern for deep space infection. Strep, COVID, and flu are negative.  This is likely viral pharyngitis.  Will give dose of Decadron in the ED.  Sent home with return precautions.   Final Clinical Impression(s) / ED Diagnoses Final diagnoses:  Viral pharyngitis    Rx / DC Orders ED Discharge Orders     None         Claudie Leach, PA-C 06/29/21 2145    Lorre Nick, MD 06/30/21 1537

## 2021-06-29 NOTE — Discharge Instructions (Signed)
You tested negative for strep throat, COVID, and flu. This is likely a viral pharyngitis which will typically get better on its own. If you do not start to improve, please return to the ED

## 2021-08-24 ENCOUNTER — Emergency Department (HOSPITAL_COMMUNITY): Payer: Medicaid Other

## 2021-08-24 ENCOUNTER — Emergency Department (HOSPITAL_COMMUNITY)
Admission: EM | Admit: 2021-08-24 | Discharge: 2021-08-24 | Disposition: A | Discharge status: Home / Self Care | Payer: Medicaid Other | Attending: Emergency Medicine | Admitting: Emergency Medicine

## 2021-08-24 ENCOUNTER — Encounter (HOSPITAL_COMMUNITY): Payer: Self-pay

## 2021-08-24 DIAGNOSIS — N760 Acute vaginitis: Secondary | ICD-10-CM | POA: Diagnosis not present

## 2021-08-24 DIAGNOSIS — N3000 Acute cystitis without hematuria: Secondary | ICD-10-CM | POA: Diagnosis not present

## 2021-08-24 DIAGNOSIS — R102 Pelvic and perineal pain: Secondary | ICD-10-CM | POA: Diagnosis present

## 2021-08-24 LAB — CBC WITH DIFFERENTIAL/PLATELET
Abs Immature Granulocytes: 0.03 10*3/uL (ref 0.00–0.07)
Basophils Absolute: 0.1 10*3/uL (ref 0.0–0.1)
Basophils Relative: 1 %
Eosinophils Absolute: 0.2 10*3/uL (ref 0.0–0.5)
Eosinophils Relative: 2 %
HCT: 44.7 % (ref 36.0–46.0)
Hemoglobin: 14.8 g/dL (ref 12.0–15.0)
Immature Granulocytes: 0 %
Lymphocytes Relative: 27 %
Lymphs Abs: 3.1 10*3/uL (ref 0.7–4.0)
MCH: 31.8 pg (ref 26.0–34.0)
MCHC: 33.1 g/dL (ref 30.0–36.0)
MCV: 96.1 fL (ref 80.0–100.0)
Monocytes Absolute: 0.8 10*3/uL (ref 0.1–1.0)
Monocytes Relative: 7 %
Neutro Abs: 7.2 10*3/uL (ref 1.7–7.7)
Neutrophils Relative %: 63 %
Platelets: 208 10*3/uL (ref 150–400)
RBC: 4.65 MIL/uL (ref 3.87–5.11)
RDW: 12.7 % (ref 11.5–15.5)
WBC: 11.3 10*3/uL — ABNORMAL HIGH (ref 4.0–10.5)
nRBC: 0 % (ref 0.0–0.2)

## 2021-08-24 LAB — URINALYSIS, ROUTINE W REFLEX MICROSCOPIC
Bilirubin Urine: NEGATIVE
Glucose, UA: NEGATIVE mg/dL
Hgb urine dipstick: NEGATIVE
Ketones, ur: 5 mg/dL — AB
Nitrite: NEGATIVE
Protein, ur: NEGATIVE mg/dL
Specific Gravity, Urine: 1.015 (ref 1.005–1.030)
WBC, UA: >50 WBC/hpf — ABNORMAL HIGH (ref 0–5)
pH: 6 (ref 5.0–8.0)

## 2021-08-24 LAB — BASIC METABOLIC PANEL
Anion gap: 8 (ref 5–15)
BUN: 6 mg/dL (ref 6–20)
CO2: 27 mmol/L (ref 22–32)
Calcium: 9.3 mg/dL (ref 8.9–10.3)
Chloride: 101 mmol/L (ref 98–111)
Creatinine, Ser: 0.65 mg/dL (ref 0.44–1.00)
GFR, Estimated: >60 mL/min (ref >60)
Glucose, Bld: 94 mg/dL (ref 70–99)
Potassium: 3.6 mmol/L (ref 3.5–5.1)
Sodium: 136 mmol/L (ref 135–145)

## 2021-08-24 LAB — WET PREP, GENITAL
Sperm: NONE SEEN
Trich, Wet Prep: NONE SEEN
WBC, Wet Prep HPF POC: <10 (ref <10)
Yeast Wet Prep HPF POC: NONE SEEN

## 2021-08-24 LAB — PREGNANCY, URINE: Preg Test, Ur: NEGATIVE

## 2021-08-24 LAB — HIV ANTIBODY (ROUTINE TESTING W REFLEX): HIV Screen 4th Generation wRfx: NONREACTIVE

## 2021-08-24 MED ORDER — NITROFURANTOIN MONOHYD MACRO 100 MG PO CAPS: 100.0000 mg | ORAL_CAPSULE | Freq: Two times a day (BID) | ORAL | 0 refills | Status: DC

## 2021-08-24 MED ORDER — ONDANSETRON 4 MG PO TBDP
4.0000 mg | ORAL_TABLET | Freq: Once | ORAL | Status: AC
  Administered 2021-08-24: 4 mg via ORAL
  Filled 2021-08-24: qty 1

## 2021-08-24 MED ORDER — METRONIDAZOLE 500 MG PO TABS: 500.0000 mg | ORAL_TABLET | Freq: Two times a day (BID) | ORAL | 0 refills | Status: DC

## 2021-08-24 NOTE — ED Provider Notes (Signed)
?[facility] DEPT ?Provider Note ? ? ?CSN: [id number] ?Arrival date & time: 08/24/21  [id number] ? ?  ? ?History ? ?Chief Complaint  ?Patient presents with  ? Vaginal Bleeding  ? ? ?Karla Torres is a 21 y.o. female with no known past medical history presenting today with vaginal bleeding and pelvic pain.  She reports that she had a [place] IUD placed in July 2021 and did not have a menstrual period all of 2022.  3 weeks ago she began to bleed again.  This bleeding has been constant and the cramping has been severe.  She reports she did stop bleeding last night.  Reports being sexually active and no possibility of being pregnant.  Also feels as though she has a urinary tract infection due to the feeling of incomplete voiding.  She has been nauseated and taking over-the-counter medications that have somewhat been helping however nausea always comes back.  She is concerned her IUD could be out of place because her mother experienced similar.  Does not have an OB/GYN or primary care provider.  Has been using ibuprofen for pain however this is not consistently relieving her symptoms.  Endorsing some lightheadedness and fatigue but denies chest pain or dyspnea. ? ? ? ?Home Medications ?Prior to Admission medications   ?Medication Sig Start Date End Date Taking? Authorizing Provider  ?albuterol (VENTOLIN HFA) 108 (90 Base) MCG/ACT inhaler Inhale 1-2 puffs into the lungs every 6 (six) hours as needed for wheezing or shortness of breath. 09/24/20   Quintella Reichert, MD  ?benzonatate (TESSALON) 100 MG capsule Take 1 capsule (100 mg total) by mouth 3 (three) times daily as needed for cough. 09/24/20   Quintella Reichert, MD  ?cetirizine (ZYRTEC) 10 MG tablet Take 1 tablet (10 mg total) by mouth daily. 05/16/21   Sherrill Raring, PA-C  ?doxycycline (VIBRAMYCIN) 100 MG capsule Take 1 capsule (100 mg total) by mouth 2 (two) times daily. 05/03/21   Regan Lemming, MD  ?levonorgestrel (KYLEENA) 19.5 MG IUD 19.5 mg by  Intrauterine route once. Placed: 12/23/19  Removal date: 12/22/24 Lot: [id number]  Exp date: 04/2022    [provider]  ?olopatadine (PATANOL) 0.1 % ophthalmic solution Place 1 drop into both eyes 2 (two) times daily. 05/16/21   Sherrill Raring, PA-C  ?   ? ?Allergies    ?Amoxicillin   ? ?Review of Systems   ?Review of Systems  ?Constitutional:  Positive for fatigue. Negative for chills and fever.  ?Respiratory:  Negative for shortness of breath.   ?Gastrointestinal:  Positive for nausea. Negative for abdominal pain, diarrhea and vomiting.  ?Genitourinary:  Positive for menstrual problem, pelvic pain, urgency and vaginal bleeding. Negative for dysuria, hematuria, vaginal discharge and vaginal pain.  ?Neurological:  Positive for light-headedness.  ?See HPI ? ?Physical Exam ?Updated Vital Signs ?BP (!) 158/104 (BP Location: Left Arm)   Pulse 72   Temp 98 ?F (36.7 ?C) (Oral)   Resp 18   SpO2 96%  ?Physical Exam ?Vitals and nursing note reviewed.  ?Constitutional:   ?   General: She is not in acute distress. ?   Appearance: Normal appearance. She is not ill-appearing.  ?HENT:  ?   Head: Normocephalic and atraumatic.  ?Eyes:  ?   General: No scleral icterus. ?   Conjunctiva/sclera: Conjunctivae normal.  ?Pulmonary:  ?   Effort: Pulmonary effort is normal. No respiratory distress.  ?Abdominal:  ?   General: Abdomen is flat.  ?   Palpations: Abdomen is soft.  ?  Comments: Mild tenderness to palpation to the pelvic area bilaterally.  No suprapubic or abdominal tenderness.  ?Genitourinary: ?   Comments: Pelvic exam performed in the presence of nurse tech.  Patient was without external lesions.  Vaginal vault without signs of blood.  Cervix is closed and without abnormalities.  Mild amounts of cloudy white discharge in the vagina. ?Skin: ?   General: Skin is warm and dry.  ?   Findings: No rash.  ?Neurological:  ?   Mental Status: She is alert.  ?Psychiatric:     ?   Mood and Affect: Mood normal.  ? ? ?ED Results /  Procedures / Treatments   ?Labs ?(all labs ordered are listed, but only abnormal results are displayed) ?Labs Reviewed  ?WET PREP, GENITAL - Abnormal; Notable for the following components:  ?    Result Value  ? Clue Cells Wet Prep HPF POC PRESENT (*)   ? All other components within normal limits  ?CBC WITH DIFFERENTIAL/PLATELET - Abnormal; Notable for the following components:  ? WBC 11.3 (*)   ? All other components within normal limits  ?URINALYSIS, ROUTINE W REFLEX MICROSCOPIC - Abnormal; Notable for the following components:  ? APPearance HAZY (*)   ? Ketones, ur 5 (*)   ? Leukocytes,Ua MODERATE (*)   ? WBC, UA >50 (*)   ? Bacteria, UA RARE (*)   ? Non Squamous Epithelial 0-5 (*)   ? All other components within normal limits  ?BASIC METABOLIC PANEL  ?PREGNANCY, URINE  ?HIV ANTIBODY (ROUTINE TESTING W REFLEX)  ?GC/CHLAMYDIA PROBE AMP ([place]) NOT AT [facility]  ? ? ?EKG ?None ? ?Radiology ?US Transvaginal Non-OB ? ?Result Date: 08/24/2021 ?CLINICAL DATA:  Pelvic pain EXAM: TRANSABDOMINAL AND TRANSVAGINAL ULTRASOUND OF PELVIS DOPPLER ULTRASOUND OF OVARIES TECHNIQUE: Both transabdominal and transvaginal ultrasound examinations of the pelvis were performed. Transabdominal technique was performed for global imaging of the pelvis including uterus, ovaries, adnexal regions, and pelvic cul-de-sac. It was necessary to proceed with endovaginal exam following the transabdominal exam to visualize the endometrium and ovaries. Color and duplex Doppler ultrasound was utilized to evaluate blood flow to the ovaries. COMPARISON:  None. FINDINGS: Uterus Measurements: 5.7 x 3.0 x 3.4 cm = volume: 29 mL. No fibroids or other mass visualized. Endometrium Thickness: 3 mm.  No focal abnormality visualized. Right ovary Measurements: 3.2 x 2.0 x 2.1 cm = volume: 7 mL. Normal appearance/no adnexal mass. Multiple small follicles. Left ovary Measurements: 3.1 x 2.2 x 2.0 cm = volume: 7 mL. Normal appearance/no adnexal mass. Cyst or follicle  measuring 1.9 cm. No followup imaging recommended. Note: This recommendation does not apply to premenarchal patients or to those with increased risk (genetic, family history, elevated tumor markers or other high-risk factors) of ovarian cancer. Reference: Radiology 2019 Nov; 293(2):359-371. Pulsed Doppler evaluation of both ovaries demonstrates normal low-resistance arterial and venous waveforms. Other findings No abnormal free fluid. IMPRESSION: No ultrasound findings of the pelvis to explain pain. Electronically Signed   By: Delanna Ahmadi M.D.   On: 08/24/2021 11:12  ? ?US Pelvis Complete ? ?Result Date: 08/24/2021 ?CLINICAL DATA:  Pelvic pain EXAM: TRANSABDOMINAL AND TRANSVAGINAL ULTRASOUND OF PELVIS DOPPLER ULTRASOUND OF OVARIES TECHNIQUE: Both transabdominal and transvaginal ultrasound examinations of the pelvis were performed. Transabdominal technique was performed for global imaging of the pelvis including uterus, ovaries, adnexal regions, and pelvic cul-de-sac. It was necessary to proceed with endovaginal exam following the transabdominal exam to visualize the endometrium and ovaries. Color and duplex Doppler  ultrasound was utilized to evaluate blood flow to the ovaries. COMPARISON:  None. FINDINGS: Uterus Measurements: 5.7 x 3.0 x 3.4 cm = volume: 29 mL. No fibroids or other mass visualized. Endometrium Thickness: 3 mm.  No focal abnormality visualized. Right ovary Measurements: 3.2 x 2.0 x 2.1 cm = volume: 7 mL. Normal appearance/no adnexal mass. Multiple small follicles. Left ovary Measurements: 3.1 x 2.2 x 2.0 cm = volume: 7 mL. Normal appearance/no adnexal mass. Cyst or follicle measuring 1.9 cm. No followup imaging recommended. Note: This recommendation does not apply to premenarchal patients or to those with increased risk (genetic, family history, elevated tumor markers or other high-risk factors) of ovarian cancer. Reference: Radiology 2019 Nov; 293(2):359-371. Pulsed Doppler evaluation of both  ovaries demonstrates normal low-resistance arterial and venous waveforms. Other findings No abnormal free fluid. IMPRESSION: No ultrasound findings of the pelvis to explain pain. Electronically Signed   By: Mal Misty

## 2021-08-24 NOTE — ED Triage Notes (Signed)
Pt presents with c/o vaginal bleeding and cramping. Pt reports she has an IUD, has been having vaginal bleeding for approx 3 weeks. Pt also reports nausea.  ?

## 2021-08-24 NOTE — Discharge Instructions (Addendum)
You have a urinary tract infection today.  You also have bacterial vaginosis.  Antibiotics for both of these are your pharmacy.  You are listed to have an amoxicillin allergy, we will do Macrobid for your urinary tract infection instead.  This is a twice daily medication. ? ?Please follow-up with an OB/GYN.  I have attached you to these discharge papers.  Also, there are 2 clinics that you may try and get established with for primary care. ?

## 2021-08-25 LAB — GC/CHLAMYDIA PROBE AMP (~~LOC~~) NOT AT ARMC
Chlamydia: NEGATIVE
Comment: NEGATIVE
Comment: NORMAL
Neisseria Gonorrhea: NEGATIVE

## 2021-11-28 ENCOUNTER — Emergency Department (HOSPITAL_COMMUNITY)
Admission: EM | Admit: 2021-11-28 | Discharge: 2021-11-29 | Disposition: A | Discharge status: Home / Self Care | Payer: Medicaid Other | Attending: Emergency Medicine | Admitting: Emergency Medicine

## 2021-11-28 DIAGNOSIS — J029 Acute pharyngitis, unspecified: Secondary | ICD-10-CM | POA: Insufficient documentation

## 2021-11-29 ENCOUNTER — Other Ambulatory Visit: Payer: Self-pay

## 2021-11-29 LAB — GROUP A STREP BY PCR: Group A Strep by PCR: NOT DETECTED

## 2021-11-29 MED ORDER — ACETAMINOPHEN 500 MG PO TABS
1000.0000 mg | ORAL_TABLET | Freq: Once | ORAL | Status: AC
  Administered 2021-11-29: 1000 mg via ORAL
  Filled 2021-11-29: qty 2

## 2021-11-29 MED ORDER — CEPHALEXIN 500 MG PO CAPS: 500.0000 mg | ORAL_CAPSULE | Freq: Two times a day (BID) | ORAL | 0 refills | Status: AC

## 2021-11-29 MED ORDER — CEPHALEXIN 500 MG PO CAPS: 500.0000 mg | ORAL_CAPSULE | Freq: Two times a day (BID) | ORAL | 0 refills | Status: DC

## 2021-11-29 MED ORDER — CEPHALEXIN 500 MG PO CAPS
500.0000 mg | ORAL_CAPSULE | Freq: Once | ORAL | Status: AC
Start: 2021-11-29 — End: 2021-11-29
  Administered 2021-11-29: 500 mg via ORAL
  Filled 2021-11-29: qty 1

## 2021-11-29 NOTE — ED Notes (Signed)
I provided reinforced discharge education based off of discharge instructions. Pt acknowledged and understood my education. Pt had no further questions/concerns for provider/myself.  °

## 2021-11-29 NOTE — ED Triage Notes (Signed)
Pt reports with sore throat and fever since today. 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

## 2021-11-29 NOTE — ED Provider Notes (Signed)
[facility]-EMERGENCY DEPT [facility] Emergency Department Provider Note MRN:  [id number]  Arrival date & time: 11/29/21     Chief Complaint   Sore Throat and Fever   History of Present Illness   Karla Torres is a 21 y.o. year-old female presents to the ED with chief complaint of sore throat x 2 days.  Reports associated fever.  Denies cough.  Works around children.   History provided by patient.   Review of Systems  Pertinent review of systems noted in HPI.    Physical Exam   Vitals:   11/28/21 2356  BP: (!) 153/99  Pulse: (!) 110  Resp: 18  Temp: (!) 100.9 F (38.3 C)  SpO2: 97%    CONSTITUTIONAL:  well-appearing, NAD NEURO:  Alert and oriented x 3, CN 3-12 grossly intact EYES:  eyes equal and reactive ENT/NECK:  Supple, no stridor, oropharynx is erythematous without exudates or abscess CARDIO:  tachycardic (thought 2/2 fever), regular rhythm, appears well-perfused  PULM:  No respiratory distress,  GI/GU:  non-distended,  MSK/SPINE:  No gross deformities, no edema, moves all extremities  SKIN:  no rash, atraumatic   *Additional and/or pertinent findings included in MDM below  Diagnostic and Interventional Summary    EKG Interpretation  Date/Time:    Ventricular Rate:    PR Interval:    QRS Duration:   QT Interval:    QTC Calculation:   R Axis:     Text Interpretation:         Labs Reviewed  GROUP A STREP BY PCR    No orders to display    Medications  cephALEXin (KEFLEX) capsule 500 mg (has no administration in time range)  acetaminophen (TYLENOL) tablet 1,000 mg (has no administration in time range)     Procedures  /  Critical Care Procedures  ED Course and Medical Decision Making  I have reviewed the triage vital signs, the nursing notes, and pertinent available records from the EMR.  Social Determinants Affecting Complexity of Care: Patient has no clinically significant social determinants affecting this chief complaint..   ED  Course:   Patient here with sore throat.  Top differential diagnoses include strep, mono, HFMD. Medical Decision Making Here with sore throat x 2 days.  Will treat for strep.  Allergic to amox.  Has taken keflex for strep in the past with good results.  No abscess.  Tylenol for fever.    Problems Addressed: Acute pharyngitis, unspecified etiology: acute illness or injury  Risk OTC drugs. Prescription drug management.     Consultants: No consultations were needed in caring for this patient.   Treatment and Plan: Emergency department workup does not suggest an emergent condition requiring admission or immediate intervention beyond  what has been performed at this time. The patient is safe for discharge and has  been instructed to return immediately for worsening symptoms, change in  symptoms or any other concerns    Final Clinical Impressions(s) / ED Diagnoses     ICD-10-CM   1. Acute pharyngitis, unspecified etiology  J02.9       ED Discharge Orders          Ordered    cephALEXin (KEFLEX) 500 MG capsule  2 times daily        11/29/21 0035              Discharge Instructions Discussed with and Provided to Patient:   Discharge Instructions   None      Roxy Horseman, PA-C 11/29/21  2409    Paula Libra, MD 11/29/21 [phone]

## 2022-02-19 ENCOUNTER — Emergency Department (HOSPITAL_COMMUNITY): Payer: Medicaid Other

## 2022-02-19 ENCOUNTER — Emergency Department (HOSPITAL_COMMUNITY)
Admission: EM | Admit: 2022-02-19 | Discharge: 2022-02-19 | Disposition: A | Discharge status: Home / Self Care | Payer: Medicaid Other | Attending: Emergency Medicine | Admitting: Emergency Medicine

## 2022-02-19 ENCOUNTER — Encounter (HOSPITAL_COMMUNITY): Payer: Self-pay | Admitting: Emergency Medicine

## 2022-02-19 DIAGNOSIS — Z7951 Long term (current) use of inhaled steroids: Secondary | ICD-10-CM | POA: Insufficient documentation

## 2022-02-19 DIAGNOSIS — J029 Acute pharyngitis, unspecified: Secondary | ICD-10-CM | POA: Diagnosis present

## 2022-02-19 DIAGNOSIS — J45909 Unspecified asthma, uncomplicated: Secondary | ICD-10-CM | POA: Diagnosis not present

## 2022-02-19 DIAGNOSIS — J3501 Chronic tonsillitis: Secondary | ICD-10-CM | POA: Insufficient documentation

## 2022-02-19 DIAGNOSIS — Z20822 Contact with and (suspected) exposure to covid-19: Secondary | ICD-10-CM | POA: Diagnosis not present

## 2022-02-19 DIAGNOSIS — B9789 Other viral agents as the cause of diseases classified elsewhere: Secondary | ICD-10-CM

## 2022-02-19 LAB — GROUP A STREP BY PCR: Group A Strep by PCR: NOT DETECTED

## 2022-02-19 LAB — RESP PANEL BY RT-PCR (FLU A&B, COVID) ARPGX2
Influenza A by PCR: NEGATIVE
Influenza B by PCR: NEGATIVE
SARS Coronavirus 2 by RT PCR: NEGATIVE

## 2022-02-19 MED ORDER — ALBUTEROL SULFATE HFA 108 (90 BASE) MCG/ACT IN AERS: 1.0000 | INHALATION_SPRAY | Freq: Four times a day (QID) | RESPIRATORY_TRACT | 1 refills | Status: AC | PRN

## 2022-02-19 MED ORDER — LIDOCAINE VISCOUS HCL 2 % MT SOLN
15.0000 mL | Freq: Once | OROMUCOSAL | Status: AC
Start: 2022-02-19 — End: 2022-02-19
  Administered 2022-02-19: 15 mL via OROMUCOSAL
  Filled 2022-02-19: qty 15

## 2022-02-19 MED ORDER — LIDOCAINE VISCOUS HCL 2 % MT SOLN
15.0000 mL | OROMUCOSAL | 0 refills | Status: AC | PRN
Start: 2022-02-19 — End: ?

## 2022-02-19 NOTE — Discharge Instructions (Addendum)
Please use your inhaler as needed, you can use the viscous lidocaine to help with severe throat pain.  I recommend following up with ENT to see whether or not would be worthwhile to have your tonsils removed given recurrent sore throats, tonsillitis.  In the meantime recommend supportive care, rest, plenty of fluids, lozenges and over the counter cough and cold medications.

## 2022-02-19 NOTE — ED Triage Notes (Signed)
Patient c/o sore throat x1 week. Reports cough that worsens at night and body aches.

## 2022-02-19 NOTE — ED Provider Notes (Signed)
[facility]-EMERGENCY DEPT Provider Note   CSN: [id number] Arrival date & time: 02/19/22  1058     History  Chief Complaint  Patient presents with   Sore Throat    Karla Torres is a 21 y.o. female with past medical history significant for asthma, recurrent sore throats, marijuana use who presents with concern for sore throat, congestion, intermittent chest tightness, coughing for around a week.  Patient denies significant fever, chills, nausea, vomiting, headache, smell or taste changes.  Patient reports that she often has enlarged tonsils and gets recurrent sore throats. No chest pain at this time. Also requesting inhaler refill.   Sore Throat       Home Medications Prior to Admission medications   Medication Sig Start Date End Date Taking? Authorizing Provider  lidocaine (XYLOCAINE) 2 % solution Use as directed 15 mLs in the mouth or throat as needed for mouth pain. 02/19/22  Yes Delsy Etzkorn H, PA-C  acetaminophen (TYLENOL) 500 MG tablet Take 1,000 mg by mouth daily as needed for moderate pain.    [provider]  albuterol (VENTOLIN HFA) 108 (90 Base) MCG/ACT inhaler Inhale 1-2 puffs into the lungs every 6 (six) hours as needed for wheezing or shortness of breath. 02/19/22   Jacek Colson H, PA-C  cephALEXin (KEFLEX) 500 MG capsule Take 1 capsule (500 mg total) by mouth 2 (two) times daily. 11/29/21   Roxy Horseman, PA-C  cetirizine (ZYRTEC) 10 MG tablet Take 1 tablet (10 mg total) by mouth daily. Patient taking differently: Take 10 mg by mouth daily as needed for allergies. 05/16/21   Theron Arista, PA-C  ibuprofen (ADVIL) 200 MG tablet Take 400 mg by mouth daily as needed for headache.    [provider]  levonorgestrel (KYLEENA) 19.5 MG IUD 19.5 mg by Intrauterine route once. Placed: 12/23/19  Removal date: 12/22/24 Lot: [id number]  Exp date: 04/2022    [provider]  Meclizine HCl 25 MG CHEW Chew 50 mg by mouth daily as  needed (nausea).    [provider]  olopatadine (PATANOL) 0.1 % ophthalmic solution Place 1 drop into both eyes 2 (two) times daily. Patient not taking: Reported on 08/24/2021 05/16/21   Theron Arista, PA-C      Allergies    Amoxicillin    Review of Systems   Review of Systems  HENT:  Positive for sore throat.   All other systems reviewed and are negative.   Physical Exam Updated Vital Signs BP 136/81   Pulse 77   Temp 98.2 F (36.8 C) (Oral)   Resp 16   SpO2 97%  Physical Exam Vitals and nursing note reviewed.  Constitutional:      General: She is not in acute distress.    Appearance: Normal appearance.  HENT:     Head: Normocephalic and atraumatic.     Mouth/Throat:     Tonsils: No tonsillar exudate or tonsillar abscesses. 2+ on the right. 2+ on the left.  Eyes:     General:        Right eye: No discharge.        Left eye: No discharge.  Cardiovascular:     Rate and Rhythm: Normal rate and regular rhythm.  Pulmonary:     Effort: Pulmonary effort is normal. No respiratory distress.  Musculoskeletal:        General: No deformity.  Skin:    General: Skin is warm and dry.  Neurological:     Mental Status: She is  alert and oriented to person, place, and time.  Psychiatric:        Mood and Affect: Mood normal.        Behavior: Behavior normal.     ED Results / Procedures / Treatments   Labs (all labs ordered are listed, but only abnormal results are displayed) Labs Reviewed  RESP PANEL BY RT-PCR (FLU A&B, COVID) [id number]  GROUP A STREP BY PCR    EKG None  Radiology DG Chest 2 View  Result Date: 02/19/2022 CLINICAL DATA:  Shortness of breath. EXAM: CHEST - 2 VIEW COMPARISON:  September 24, 2020. FINDINGS: The heart size and mediastinal contours are within normal limits. Both lungs are clear. The visualized skeletal structures are unremarkable. IMPRESSION: No active cardiopulmonary disease. Electronically Signed   By: Lupita Raider M.D.   On: 02/19/2022  13:24    Procedures Procedures    Medications Ordered in ED Medications  lidocaine (XYLOCAINE) 2 % viscous mouth solution 15 mL (15 mLs Mouth/Throat Given 02/19/22 1313)    ED Course/ Medical Decision Making/ A&P                           Medical Decision Making Amount and/or Complexity of Data Reviewed Radiology: ordered.  Risk Prescription drug management.   This is an overall well-appearing 20 year old female who presents with concern for sore throat, cough, other upper respiratory infectious symptoms.  My emergent differential diagnosis includes acute bronchitis, pneumonia, upper respiratory infection, tonsillitis, pharyngitis, COVID, flu, less clinical concern for epiglottitis, Ludwig angina, other ENT emergency based on stable vital signs, and appearance on exam.  Patient has swollen 2+ tonsils, and some redness in posterior oropharynx with midline uvula, no floor of mouth swelling, redness, minimal cervical lymphadenopathy.  Independently interpreted lab work including RVP for COVID, flu, PCR for strep.  I independently interpreted imaging including plain film chest x-ray which showed no evidence of intrathoracic abnormality.  I agree with the radiologist interpretation.  This patient has signs and symptoms consistent with a viral pharyngitis or tonsillitis.  Encouraged over-the-counter cough and cold medication, will discharge with viscous lidocaine, encourage ENT follow-up given repeated visits for similar symptoms, chronically enlarged tonsils, per patient. Final Clinical Impression(s) / ED Diagnoses Final diagnoses:  Viral tonsillitis    Rx / DC Orders ED Discharge Orders          Ordered    lidocaine (XYLOCAINE) 2 % solution  As needed        02/19/22 1426    albuterol (VENTOLIN HFA) 108 (90 Base) MCG/ACT inhaler  Every 6 hours PRN        02/19/22 1426              Mazin Emma, [place], PA-C 02/19/22 1430    Wynetta Fines, MD 02/19/22 1644

## 2022-02-28 IMAGING — CR DG KNEE COMPLETE 4+V*R*
4 series · 4 of 4 positions shown · non-contrast
Comparison: None.

CLINICAL DATA: Fall with injured right knee.

EXAM:
RIGHT KNEE - COMPLETE 4+ VIEW

[t knee ap right]
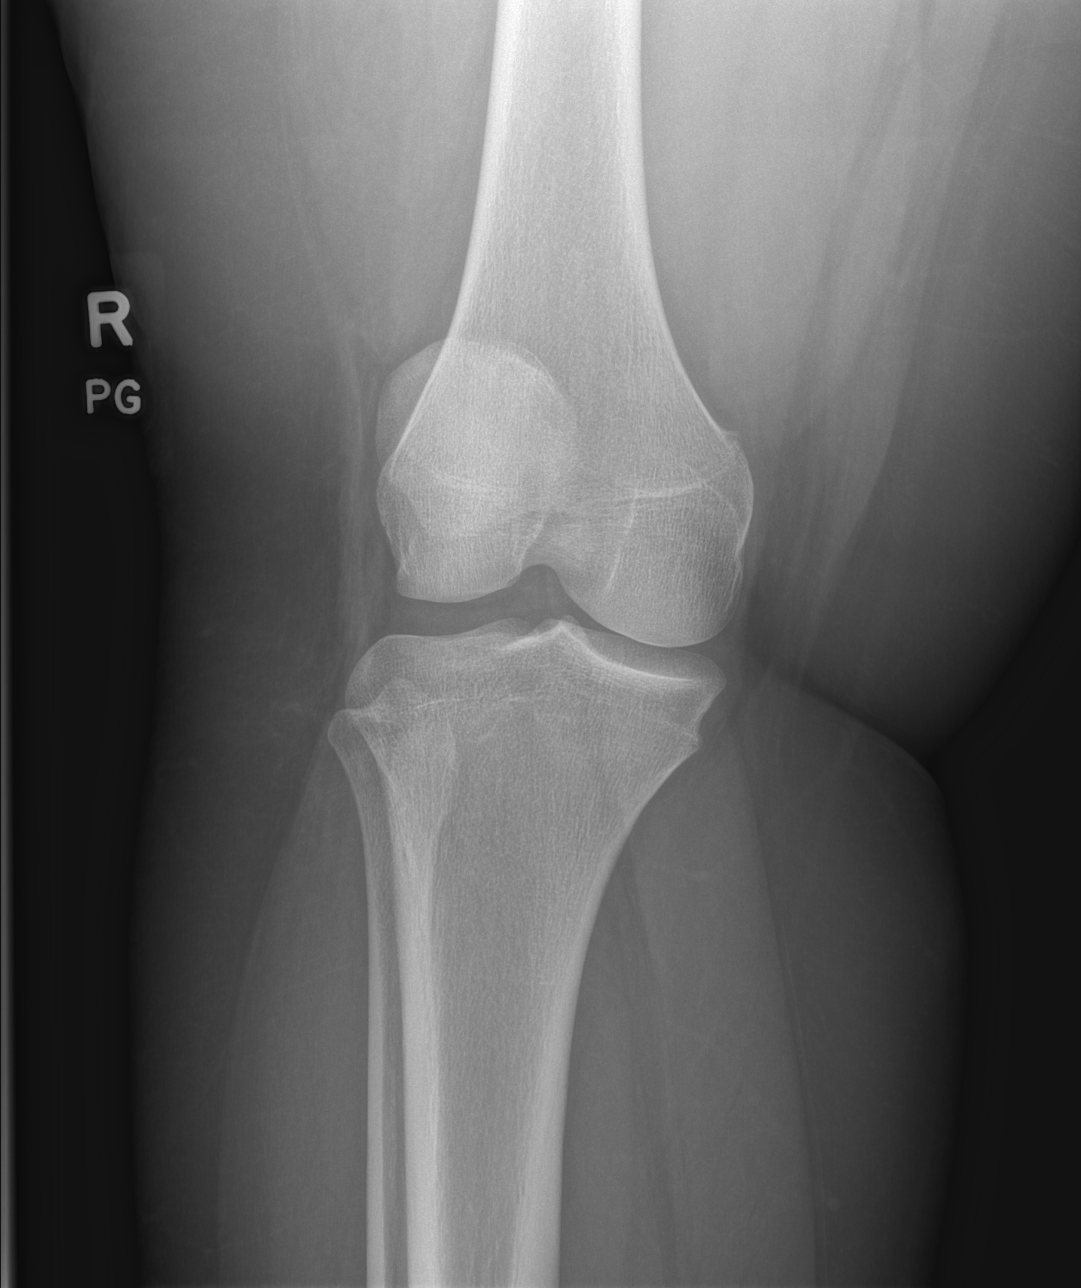

[t knee obl right (1 of 2)]
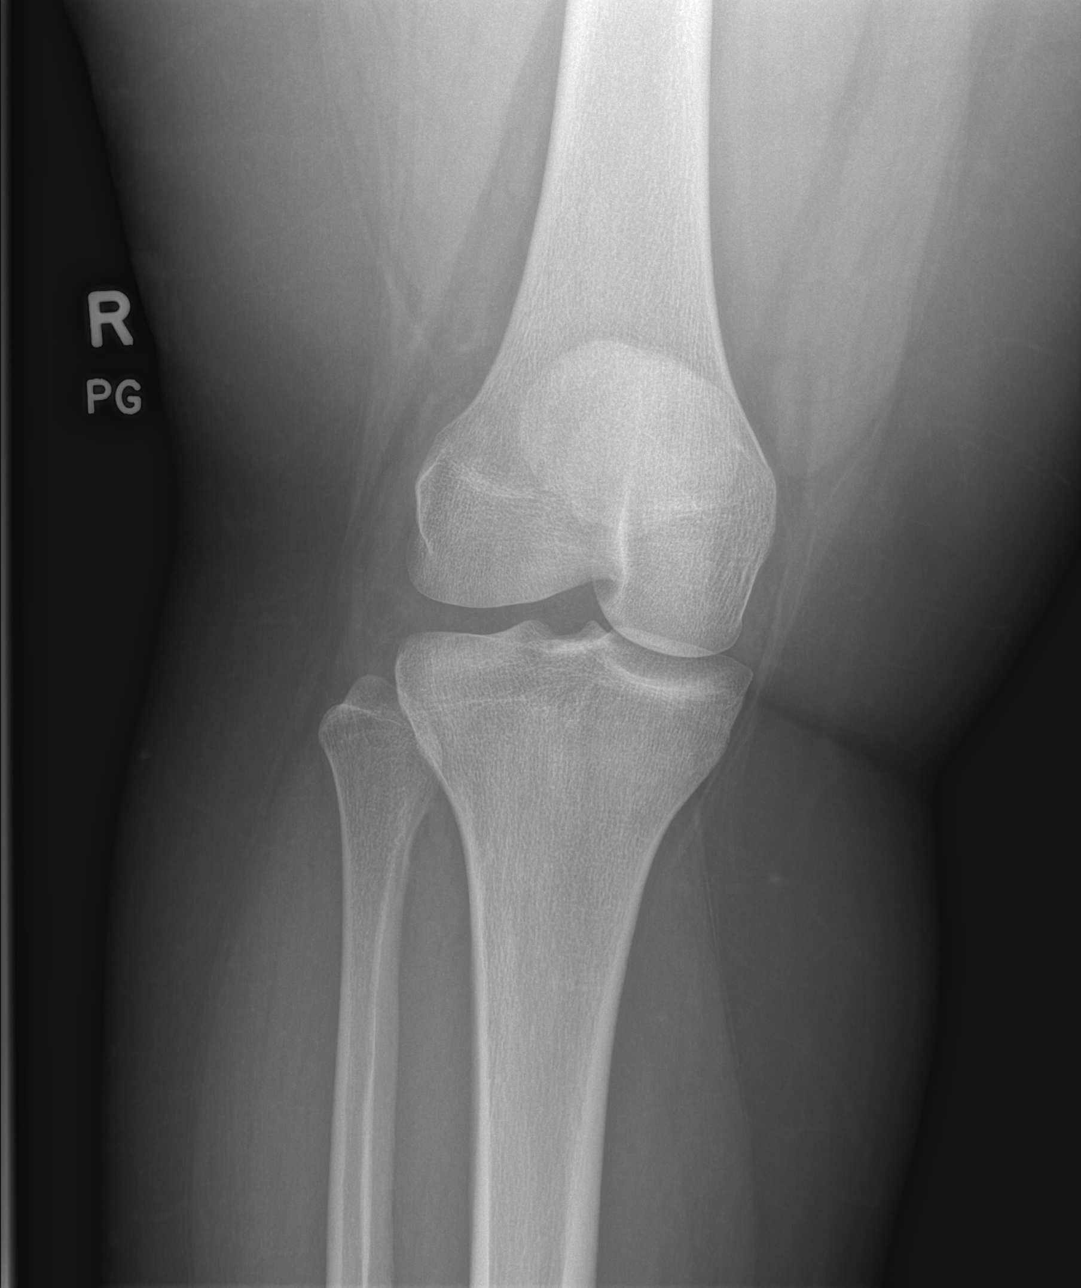

[t knee obl right (2 of 2)]
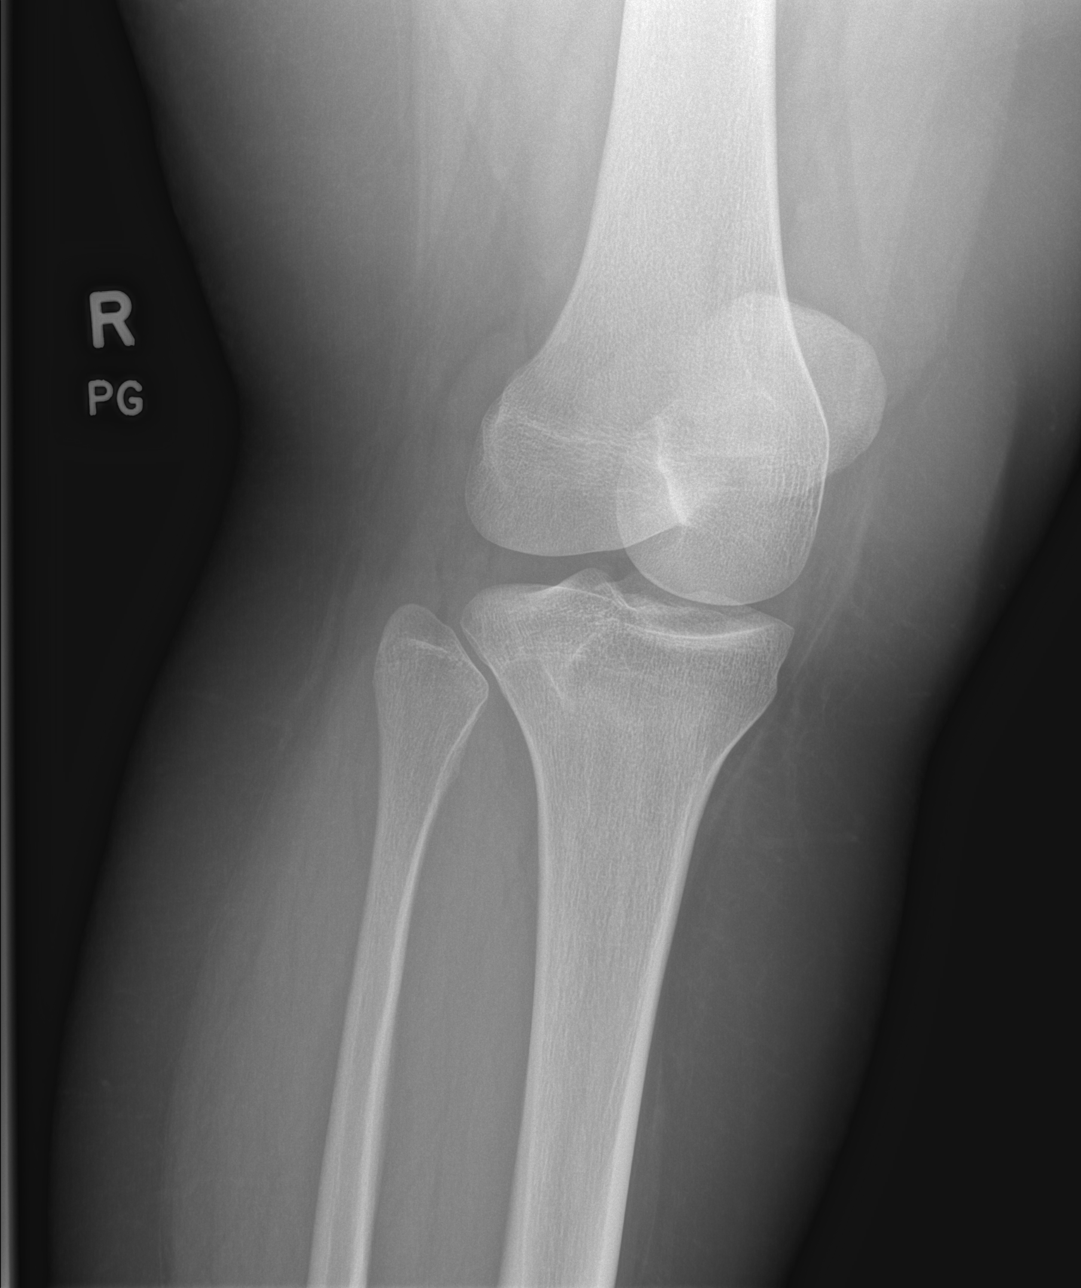

[t knee lat right]
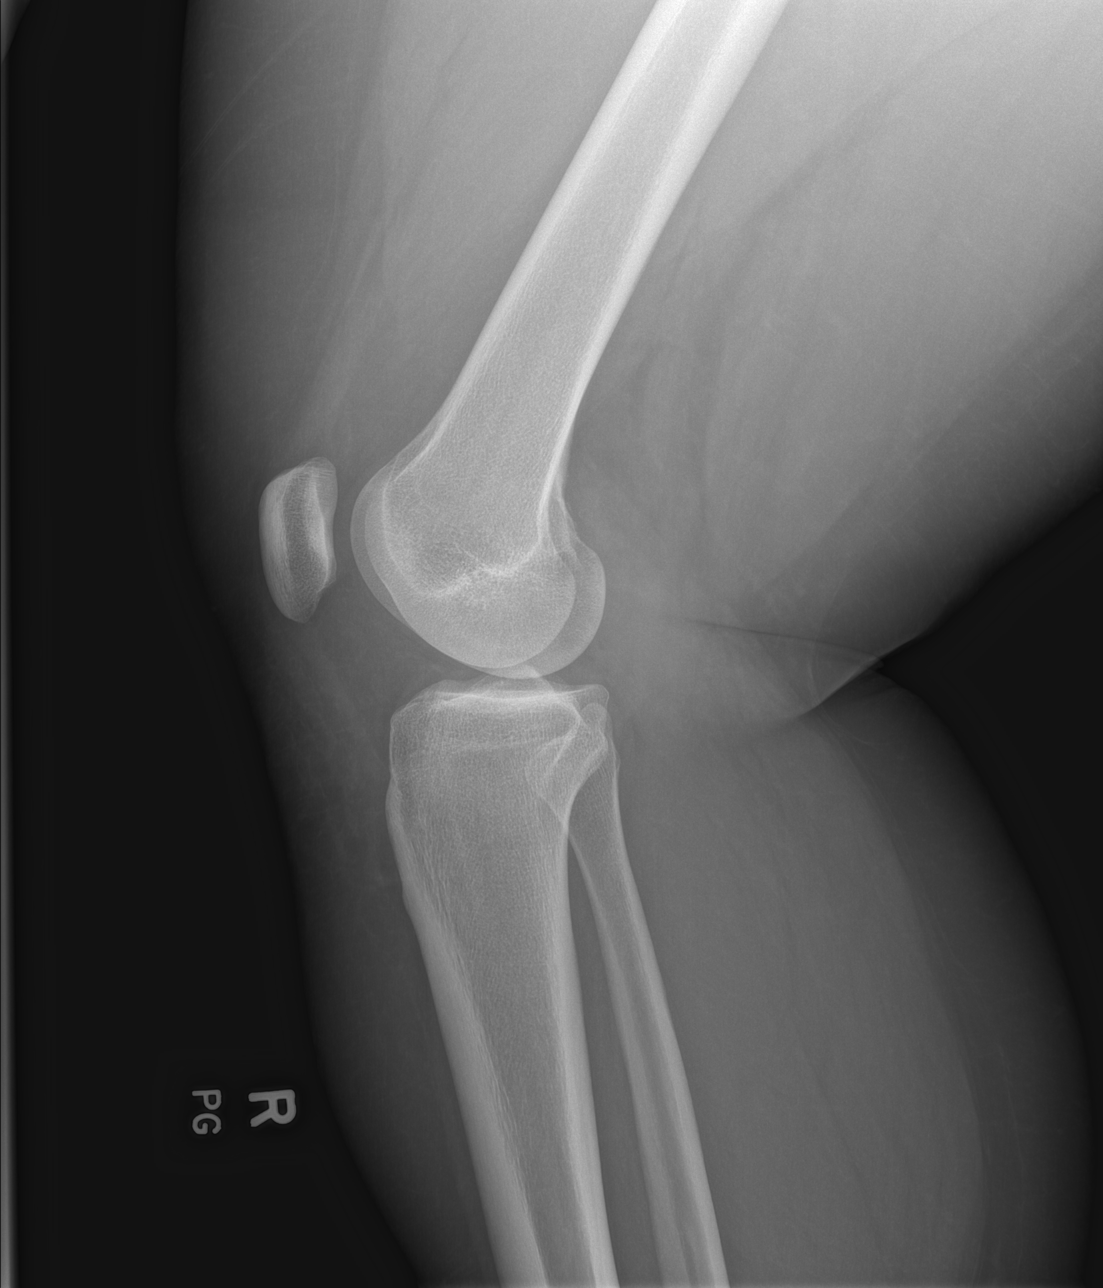

[4 of 4 positions shown; findings below may reference images not displayed]

FINDINGS: No evidence of fracture, dislocation, or joint effusion. No evidence
of arthropathy or other focal bone abnormality. Soft tissues are
unremarkable.
IMPRESSION: Negative.

## 2022-06-03 IMAGING — US US PELVIS COMPLETE
1 series · 13 of 25 positions shown · non-contrast
Comparison: None.

CLINICAL DATA: Pelvic pain

EXAM:
TRANSABDOMINAL AND TRANSVAGINAL ULTRASOUND OF PELVIS
DOPPLER ULTRASOUND OF OVARIES
TECHNIQUE: Both transabdominal and transvaginal ultrasound examinations of the
pelvis were performed. Transabdominal technique was performed for
global imaging of the pelvis including uterus, ovaries, adnexal
regions, and pelvic cul-de-sac.
It was necessary to proceed with endovaginal exam following the
transabdominal exam to visualize the endometrium and ovaries. Color
and duplex Doppler ultrasound was utilized to evaluate blood flow to
the ovaries.

[Series 1: us pelvis complete · 13 of 76 slices shown]
[im 1/76]
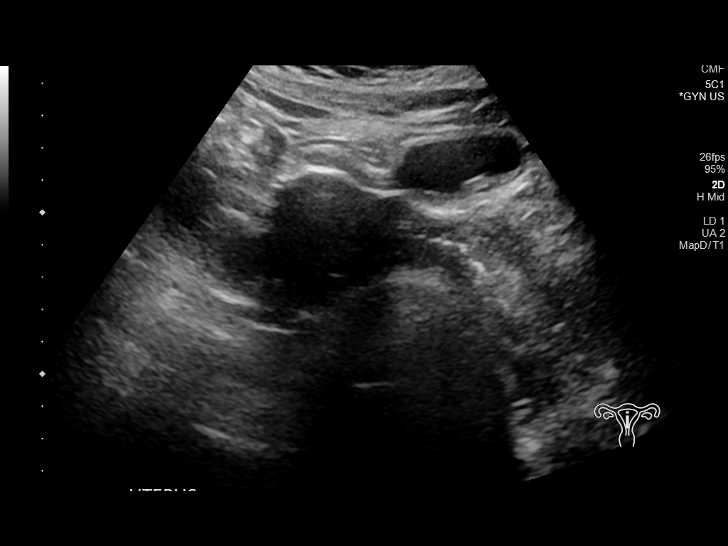
[im 7/76]
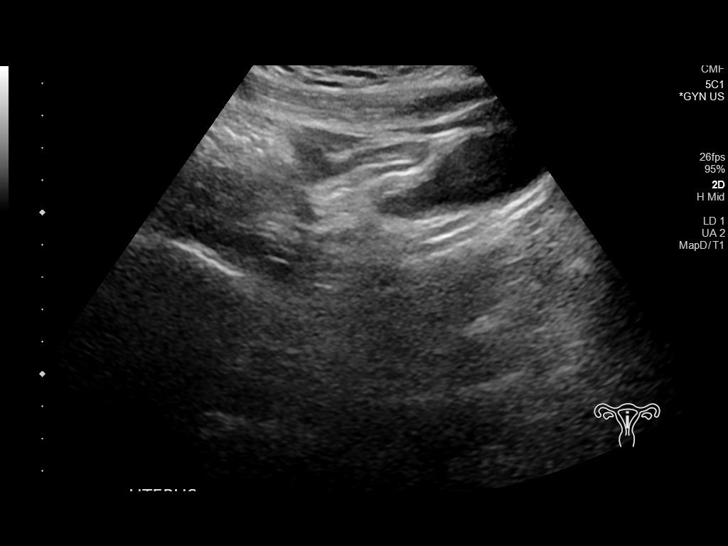
[im 13/76]
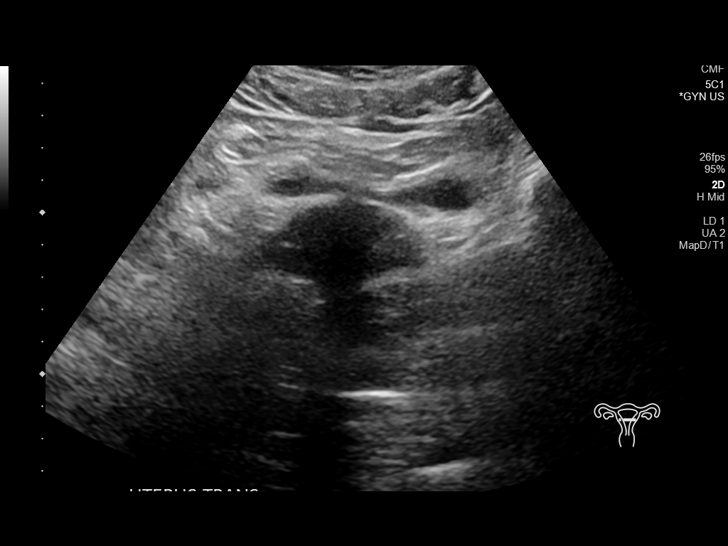
[im 19/76]
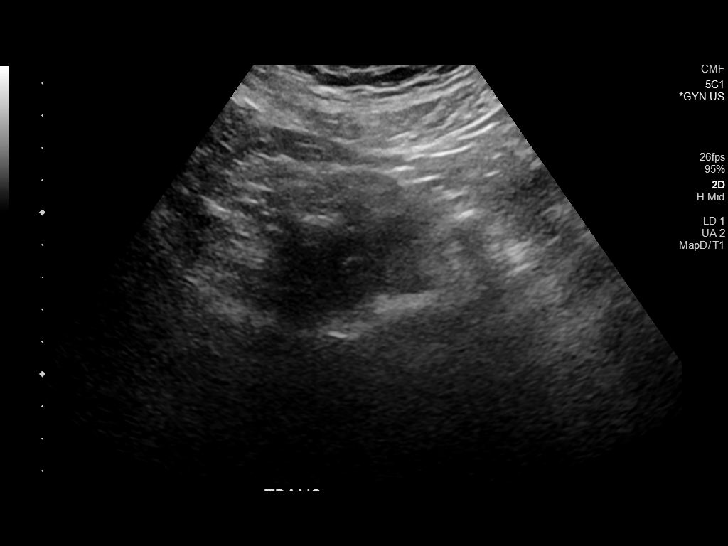
[im 26/76]
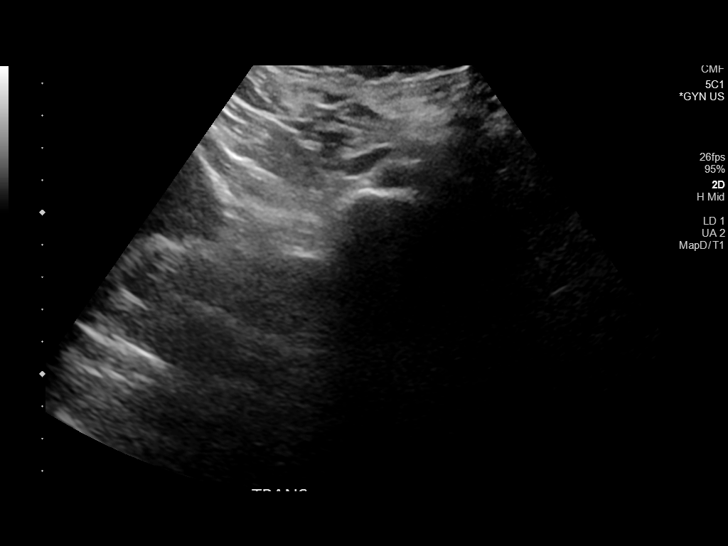
[im 32/76]
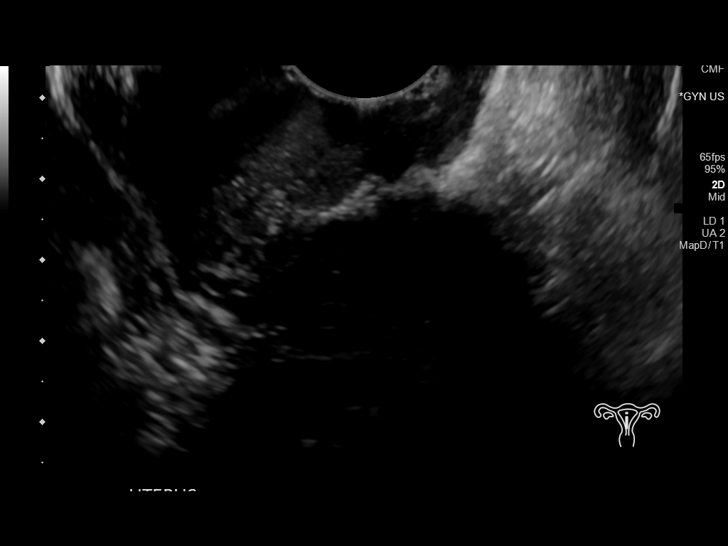
[im 38/76]
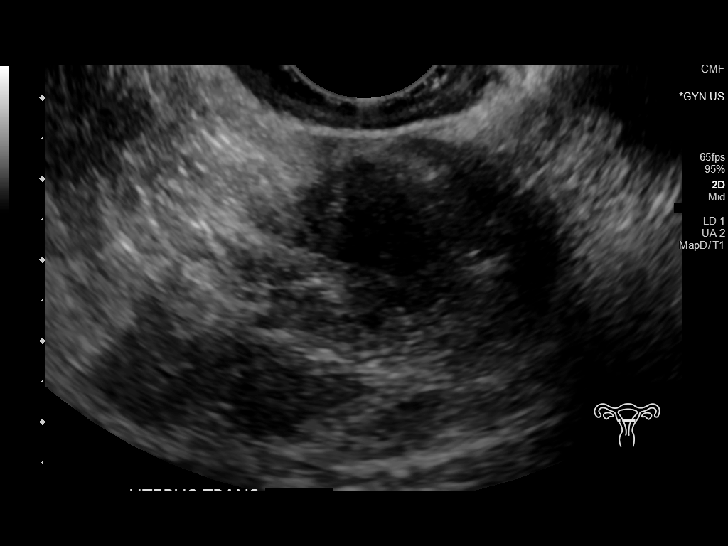
[im 44/76]
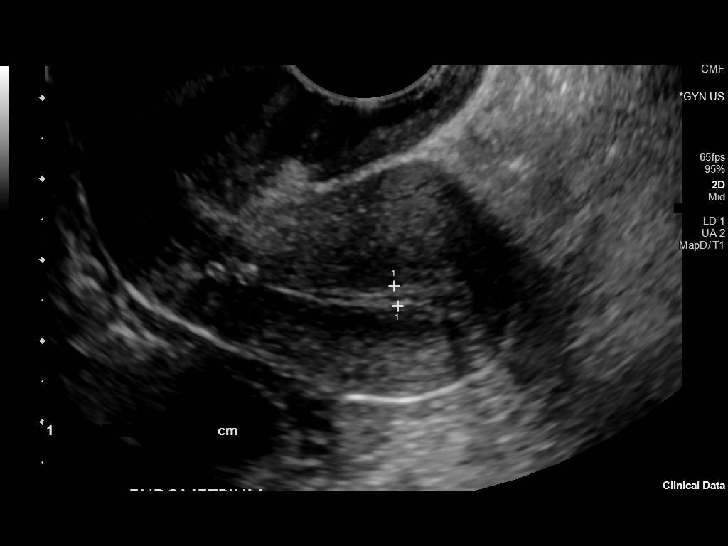
[im 51/76]
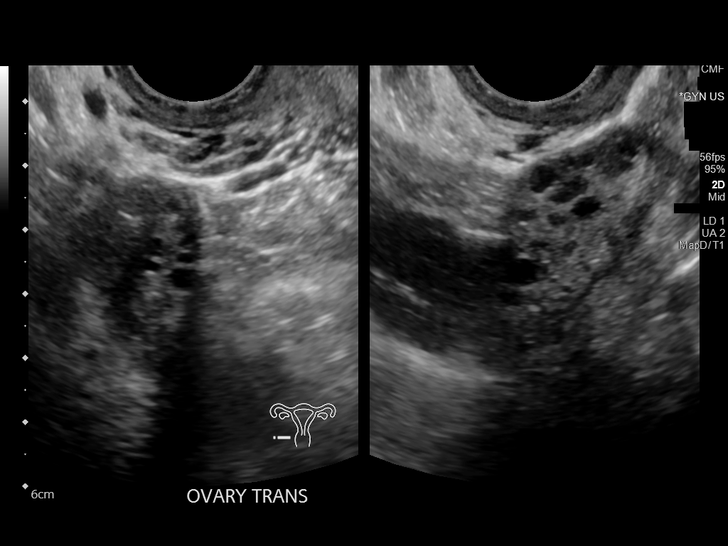
[im 57/76]
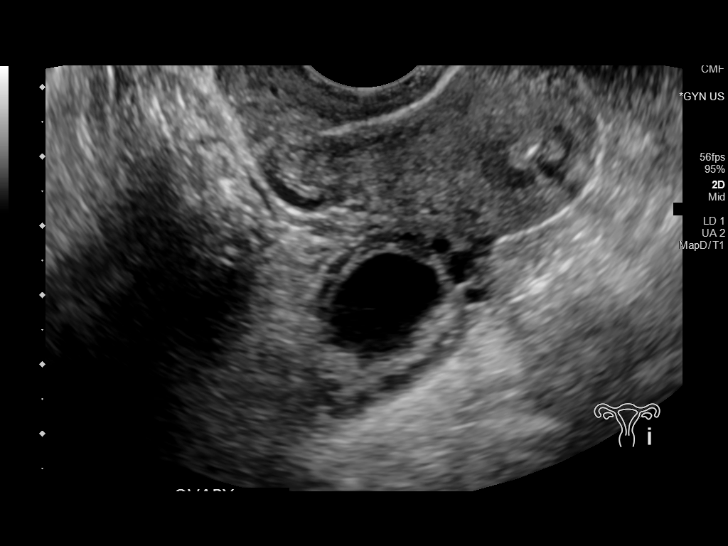
[im 63/76]
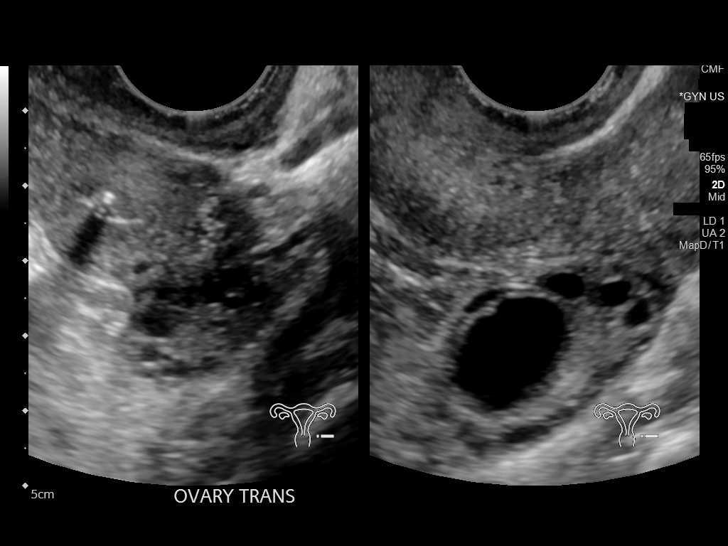
[im 69/76]
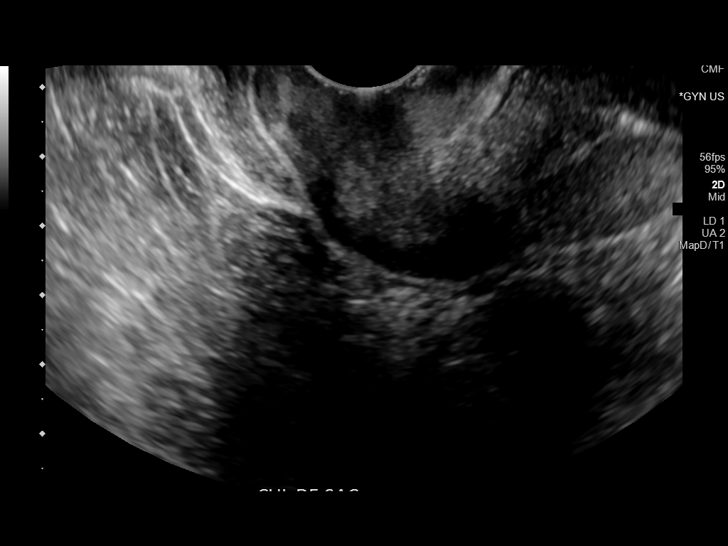
[im 76/76]
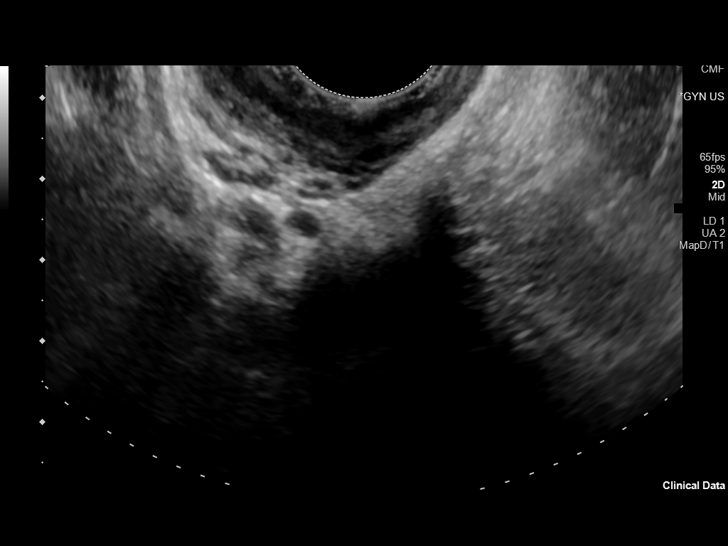

[13 of 25 positions shown; findings below may reference images not displayed]

FINDINGS: Uterus

Measurements: 5.7 x 3.0 x 3.4 cm = volume: 29 mL. No fibroids or
other mass visualized.

Endometrium

Thickness: 3 mm.  No focal abnormality visualized.

Right ovary

Measurements: 3.2 x 2.0 x 2.1 cm = volume: 7 mL. Normal
appearance/no adnexal mass. Multiple small follicles.

Left ovary

Measurements: 3.1 x 2.2 x 2.0 cm = volume: 7 mL. Normal
appearance/no adnexal mass. Cyst or follicle measuring 1.9 cm. No
followup imaging recommended. Note: This recommendation does not
apply to premenarchal patients or to those with increased risk
(genetic, family history, elevated tumor markers or other high-risk
factors) of ovarian cancer. Reference: Radiology [DATE]):359-371.

Pulsed Doppler evaluation of both ovaries demonstrates normal
low-resistance arterial and venous waveforms.

Other findings

No abnormal free fluid.
IMPRESSION: No ultrasound findings of the pelvis to explain pain.

## 2023-08-18 ENCOUNTER — Other Ambulatory Visit: Payer: Self-pay

## 2023-08-18 ENCOUNTER — Emergency Department (HOSPITAL_COMMUNITY)
Admission: EM | Admit: 2023-08-18 | Discharge: 2023-08-18 | Disposition: A | Discharge status: Home / Self Care | Attending: Emergency Medicine | Admitting: Emergency Medicine

## 2023-08-18 ENCOUNTER — Encounter (HOSPITAL_COMMUNITY): Payer: Self-pay

## 2023-08-18 DIAGNOSIS — J45909 Unspecified asthma, uncomplicated: Secondary | ICD-10-CM | POA: Insufficient documentation

## 2023-08-18 DIAGNOSIS — R197 Diarrhea, unspecified: Secondary | ICD-10-CM | POA: Diagnosis not present

## 2023-08-18 DIAGNOSIS — R1013 Epigastric pain: Secondary | ICD-10-CM | POA: Insufficient documentation

## 2023-08-18 DIAGNOSIS — R112 Nausea with vomiting, unspecified: Secondary | ICD-10-CM | POA: Diagnosis present

## 2023-08-18 LAB — URINALYSIS, ROUTINE W REFLEX MICROSCOPIC
Bilirubin Urine: NEGATIVE
Glucose, UA: NEGATIVE mg/dL
Hgb urine dipstick: NEGATIVE
Ketones, ur: 20 mg/dL — AB
Nitrite: NEGATIVE
Protein, ur: 30 mg/dL — AB
Specific Gravity, Urine: 1.033 — ABNORMAL HIGH (ref 1.005–1.030)
Squamous Epithelial / HPF: >50 /HPF (ref 0–5)
pH: 5 (ref 5.0–8.0)

## 2023-08-18 LAB — CBC
HCT: 45.4 % (ref 36.0–46.0)
Hemoglobin: 15.3 g/dL — ABNORMAL HIGH (ref 12.0–15.0)
MCH: 33.1 pg (ref 26.0–34.0)
MCHC: 33.7 g/dL (ref 30.0–36.0)
MCV: 98.3 fL (ref 80.0–100.0)
Platelets: 231 10*3/uL (ref 150–400)
RBC: 4.62 MIL/uL (ref 3.87–5.11)
RDW: 14.3 % (ref 11.5–15.5)
WBC: 12.8 10*3/uL — ABNORMAL HIGH (ref 4.0–10.5)
nRBC: 0 % (ref 0.0–0.2)

## 2023-08-18 LAB — COMPREHENSIVE METABOLIC PANEL
ALT: 12 U/L (ref 0–44)
AST: 17 U/L (ref 15–41)
Albumin: 4.1 g/dL (ref 3.5–5.0)
Alkaline Phosphatase: 63 U/L (ref 38–126)
Anion gap: 11 (ref 5–15)
BUN: 12 mg/dL (ref 6–20)
CO2: 24 mmol/L (ref 22–32)
Calcium: 9.8 mg/dL (ref 8.9–10.3)
Chloride: 102 mmol/L (ref 98–111)
Creatinine, Ser: 0.78 mg/dL (ref 0.44–1.00)
GFR, Estimated: >60 mL/min (ref >60)
Glucose, Bld: 103 mg/dL — ABNORMAL HIGH (ref 70–99)
Potassium: 3.4 mmol/L — ABNORMAL LOW (ref 3.5–5.1)
Sodium: 137 mmol/L (ref 135–145)
Total Bilirubin: 0.7 mg/dL (ref 0.0–1.2)
Total Protein: 7.6 g/dL (ref 6.5–8.1)

## 2023-08-18 LAB — RESP PANEL BY RT-PCR (RSV, FLU A&B, COVID)  RVPGX2
Influenza A by PCR: NEGATIVE
Influenza B by PCR: NEGATIVE
Resp Syncytial Virus by PCR: NEGATIVE
SARS Coronavirus 2 by RT PCR: NEGATIVE

## 2023-08-18 LAB — RAPID HIV SCREEN (HIV 1/2 AB+AG)
HIV 1/2 Antibodies: NONREACTIVE
HIV-1 P24 Antigen - HIV24: NONREACTIVE

## 2023-08-18 LAB — HCG, SERUM, QUALITATIVE: Preg, Serum: NEGATIVE

## 2023-08-18 LAB — LIPASE, BLOOD: Lipase: 21 U/L (ref 11–51)

## 2023-08-18 MED ORDER — FAMOTIDINE 20 MG PO TABS: 20.0000 mg | ORAL_TABLET | Freq: Two times a day (BID) | ORAL | 0 refills | Status: AC

## 2023-08-18 MED ORDER — ONDANSETRON 4 MG PO TBDP: 4.0000 mg | ORAL_TABLET | Freq: Three times a day (TID) | ORAL | 0 refills | Status: AC | PRN

## 2023-08-18 MED ORDER — ONDANSETRON 4 MG PO TBDP
4.0000 mg | ORAL_TABLET | Freq: Once | ORAL | Status: AC | PRN
  Administered 2023-08-18: 4 mg via ORAL
  Filled 2023-08-18: qty 1

## 2023-08-18 MED ORDER — LACTATED RINGERS IV BOLUS
1000.0000 mL | Freq: Once | INTRAVENOUS | Status: AC
  Administered 2023-08-18: 1000 mL via INTRAVENOUS

## 2023-08-18 MED ORDER — ONDANSETRON HCL 4 MG/2ML IJ SOLN
4.0000 mg | Freq: Once | INTRAMUSCULAR | Status: AC
Start: 2023-08-18 — End: 2023-08-18
  Administered 2023-08-18: 4 mg via INTRAVENOUS
  Filled 2023-08-18: qty 2

## 2023-08-18 NOTE — ED Provider Triage Note (Signed)
 Emergency Medicine Provider Triage Evaluation Note  Karla Torres , a 23 y.o. female  was evaluated in triage.  Pt complains of N/V.  Patient reports epigastric pain with nausea and vomiting 3 days ago.  Stopped yesterday and returned again this morning while at church.  Endorses associated diarrhea.  No fevers.  States that she drank a little bit of wine last night but denies any new foods.  States that she uses marijuana regularly.  Review of Systems  Positive: Nausea, vomiting, abdominal pain Negative: Fevers  Physical Exam  BP (!) 139/97   Pulse 69   Temp 97.7 F (36.5 C)   Resp 20   Ht 5\' 4"  (1.626 m)   Wt 96.2 kg   SpO2 100%   BMI 36.39 kg/m  Gen:   Awake, no distress   Resp:  Normal effort  MSK:   Moves extremities without difficulty  Other:  TTP of the epigastric area  Medical Decision Making  Medically screening exam initiated at 2:13 PM.  Appropriate orders placed.  Karla Torres was informed that the remainder of the evaluation will be completed by another provider, this initial triage assessment does not replace that evaluation, and the importance of remaining in the ED until their evaluation is complete.   Karla Marion, Karla Torres 08/18/23 1414

## 2023-08-18 NOTE — ED Provider Notes (Addendum)
 [place] EMERGENCY DEPARTMENT AT [facility] Provider Note   CSN: [id number] Arrival date & time: 08/18/23  1335     History  Chief Complaint  Patient presents with   Abdominal Pain    Karla Torres is a 23 y.o. female with PMH as listed below who presents with  N/V.  Patient reports epigastric pain with nausea and vomiting 3 days ago.  Stopped yesterday and returned again this morning while at church.  Endorses associated nonbloody watery diarrhea.  No fevers.  States that she drank a little bit of wine last night but denies any new foods.  States that she uses marijuana regularly. Denies hematemesis. Went to [facility] ED on 08/16/23 and had RUQ [place] but left prior to treatment complete d/t wait time. Per chart review in care everywhere, RUQ [place] was negative with no e/o cholelithiasis. Denies vaginal sxs, denies abnormal discharge. Also requests STD testing today.  Past Medical History:  Diagnosis Date   Asthma        Home Medications Prior to Admission medications   Medication Sig Start Date End Date Taking? Authorizing Provider  famotidine (PEPCID) 20 MG tablet Take 1 tablet (20 mg total) by mouth 2 (two) times daily. 08/18/23  Yes Loetta Rough, MD  ondansetron (ZOFRAN-ODT) 4 MG disintegrating tablet Take 1 tablet (4 mg total) by mouth every 8 (eight) hours as needed. 08/18/23  Yes Loetta Rough, MD  acetaminophen (TYLENOL) 500 MG tablet Take 1,000 mg by mouth daily as needed for moderate pain.    [provider]  albuterol (VENTOLIN HFA) 108 (90 Base) MCG/ACT inhaler Inhale 1-2 puffs into the lungs every 6 (six) hours as needed for wheezing or shortness of breath. 02/19/22   Prosperi, Christian H, PA-C  cephALEXin (KEFLEX) 500 MG capsule Take 1 capsule (500 mg total) by mouth 2 (two) times daily. 11/29/21   Roxy Horseman, PA-C  cetirizine (ZYRTEC) 10 MG tablet Take 1 tablet (10 mg total) by mouth daily. Patient taking differently: Take 10 mg by mouth daily as needed  for allergies. 05/16/21   Theron Arista, PA-C  ibuprofen (ADVIL) 200 MG tablet Take 400 mg by mouth daily as needed for headache.    [provider]  levonorgestrel (KYLEENA) 19.5 MG IUD 19.5 mg by Intrauterine route once. Placed: 12/23/19  Removal date: 12/22/24 Lot: [id number]  Exp date: 04/2022    [provider]  lidocaine (XYLOCAINE) 2 % solution Use as directed 15 mLs in the mouth or throat as needed for mouth pain. 02/19/22   Prosperi, Christian H, PA-C  Meclizine HCl 25 MG CHEW Chew 50 mg by mouth daily as needed (nausea).    [provider]  olopatadine (PATANOL) 0.1 % ophthalmic solution Place 1 drop into both eyes 2 (two) times daily. Patient not taking: Reported on 08/24/2021 05/16/21   Theron Arista, PA-C      Allergies    Amoxicillin    Review of Systems   Review of Systems A 10 point review of systems was performed and is negative unless otherwise reported in HPI.  Physical Exam Updated Vital Signs BP 134/71   Pulse 74   Temp 98 F (36.7 C) (Oral)   Resp 15   Ht 5\' 4"  (1.626 m)   Wt 96.2 kg   SpO2 100%   BMI 36.39 kg/m  Physical Exam General: Normal appearing female, lying in bed.  HEENT: PERRLA, Sclera anicteric, dry mucous membranes, trachea midline.  Cardiology: RRR, no murmurs/rubs/gallops.  Resp: Normal respiratory rate and effort. CTAB, no wheezes, rhonchi, crackles.  Abd: +Epigastric, RUQ TTP. Soft, non-tender, non-distended. No rebound tenderness or guarding.  GU: Deferred. MSK: No peripheral edema or signs of trauma.  Skin: warm, dry.  Back: No CVA tenderness Neuro: A&Ox4, CNs II-XII grossly intact. MAEs. Sensation grossly intact.  Psych: Normal mood and affect.   ED Results / Procedures / Treatments   Labs (all labs ordered are listed, but only abnormal results are displayed) Labs Reviewed  COMPREHENSIVE METABOLIC PANEL - Abnormal; Notable for the following components:      Result Value   Potassium 3.4 (*)    Glucose, Bld 103 (*)     All other components within normal limits  CBC - Abnormal; Notable for the following components:   WBC 12.8 (*)    Hemoglobin 15.3 (*)    All other components within normal limits  URINALYSIS, ROUTINE W REFLEX MICROSCOPIC - Abnormal; Notable for the following components:   Color, Urine AMBER (*)    APPearance CLOUDY (*)    Specific Gravity, Urine 1.033 (*)    Ketones, ur 20 (*)    Protein, ur 30 (*)    Leukocytes,Ua TRACE (*)    Bacteria, UA FEW (*)    All other components within normal limits  RESP PANEL BY RT-PCR (RSV, FLU A&B, COVID)  RVPGX2  LIPASE, BLOOD  HCG, SERUM, QUALITATIVE  RAPID HIV SCREEN (HIV 1/2 AB+AG)  RPR  GC/CHLAMYDIA PROBE AMP ([place]) NOT AT [facility]    EKG None  Radiology No results found.  Procedures Procedures    Medications Ordered in ED Medications  ondansetron (ZOFRAN-ODT) disintegrating tablet 4 mg (4 mg Oral Given 08/18/23 1410)  lactated ringers bolus 1,000 mL (0 mLs Intravenous Stopped 08/18/23 1956)  ondansetron (ZOFRAN) injection 4 mg (4 mg Intravenous Given 08/18/23 1901)    ED Course/ Medical Decision Making/ A&P                          Medical Decision Making Amount and/or Complexity of Data Reviewed Labs: ordered. Decision-making details documented in ED Course.  Risk Prescription drug management.    This patient presents to the ED for concern of epigastric abd pain and N/V, this involves an extensive number of treatment options, and is a complaint that carries with it a high risk of complications and morbidity.  I considered the following differential and admission for this acute, potentially life threatening condition.   MDM:    For DDX for N/V with abdominal pain includes but is not limited to:  Abdominal exam without peritoneal signs. No evidence of acute abdomen at this time. Low suspicion for acute hepatobiliary disease (including acute cholecystitis or cholangitis) with no RUQ TTP on exam, acute pancreatitis (neg  lipase). Consider gastritis/PUD, gastroenteritis, or foodborne illness. Neg pregnancy test and UA doesn't demonstrate UTI. Lower c/f SBO, GU pathology such as PID with no suprapubic TTP and no vaginal sxs or fever. No significant electrolyte derangements but patient is likely dehydrated, will give fluid resuscitation.   Clinical Course as of 08/18/23 2148  Wynelle Link Aug 18, 2023  1822 Specific Gravity, Urine(!): 1.033 dehydrated [HN]  1823 Comprehensive metabolic panel(!) Unremarkable in the context of this patient's presentation  [HN]  1823 WBC(!): 12.8 Mild leukocytosis [HN]  1823 Preg, Serum: NEGATIVE neg [HN]  1823 Lipase: 21 neg [HN]  1955 Patient stating she would like to be discharged now. She feels improved after IV zofran  but did not yet receive her IVF. She does still want STD testing. I offered IVF and additional treatment but patient would prefer to be discharged. Her abdominal exam is benign at this time and she states her pain improved with the zofran.  I will DC w/ ODT zofran, pepcid rx, and instructions to stay well hydrated. Likely gastroenteritis as a cause.  Also discussed the possibility of sxs caused by chronic marijuana use and I advised discontinuing marijuana. DC w/ discharge instructions/return precautions. All questions answered to patient's satisfaction.   [HN]    Clinical Course User Index [HN] Loetta Rough, MD    Labs: I Ordered, and personally interpreted labs.  The pertinent results include:  those listed above  Additional history obtained from chart review.    Reevaluation: After the interventions noted above, I reevaluated the patient and found that they have :improved  Social Determinants of Health: Lives independently  Disposition:  DC  Co morbidities that complicate the patient evaluation  Past Medical History:  Diagnosis Date   Asthma      Medicines Meds ordered this encounter  Medications   ondansetron (ZOFRAN-ODT) disintegrating tablet 4  mg   lactated ringers bolus 1,000 mL   ondansetron (ZOFRAN) injection 4 mg   ondansetron (ZOFRAN-ODT) 4 MG disintegrating tablet    Sig: Take 1 tablet (4 mg total) by mouth every 8 (eight) hours as needed.    Dispense:  20 tablet    Refill:  0   famotidine (PEPCID) 20 MG tablet    Sig: Take 1 tablet (20 mg total) by mouth 2 (two) times daily.    Dispense:  30 tablet    Refill:  0    I have reviewed the patients home medicines and have made adjustments as needed  Problem List / ED Course: Problem List Items Addressed This Visit   None Visit Diagnoses       Nausea vomiting and diarrhea    -  Primary     Epigastric pain               Loetta Rough, MD 08/18/23 2149

## 2023-08-18 NOTE — Discharge Instructions (Addendum)
 Thank you for coming to [facility] Emergency Department. You were seen for nausea vomiting and abdominal pain. We did an exam, labs, and imaging, and these showed no acute findings. You were offered additional treatment including IV fluids but you would prefer to be discharged.   We have prescribed Zofran under the tongue to use every 6 8 hours for nausea and vomiting.  We have also prescribed Pepcid to take twice per day for 30 days, as it is possible that reflux or gastritis could be contributing to your symptoms.  Please stay well-hydrated at home.  Please return to the emergency department if your nausea vomiting and diarrhea become so severe that he cannot eat or drink anything, or if you cannot keep up with hydration  Please follow up with your primary care provider within 1 week. Please establish with a primary care physician either online or using [facility] (number listed below).  You requested STD testing, which will result on your MyChart.  You can also follow up your viral swab result on your MyChart.  Do not hesitate to return to the ED or call 911 if you experience: -Worsening symptoms -Lightheadedness, passing out -Fevers/chills -Anything else that concerns you

## 2023-08-18 NOTE — ED Triage Notes (Addendum)
 Pt presents with a 3 day hx of N/V/D with RUQ and epigastric pain described and someone pressing in on her stomach. Pt has had 2 episodes of emesis and 2 episodes of diarrhea in the last 24 hours. Pt went to ED at [facility] initially and had some tests, but she did not complete her care at that time (test results noted in Care Everywhere). Pt has tried taking Tums and APAP, but has not been able to keep it down.

## 2023-08-19 LAB — GC/CHLAMYDIA PROBE AMP (~~LOC~~) NOT AT ARMC
Chlamydia: NEGATIVE
Comment: NEGATIVE
Comment: NORMAL
Neisseria Gonorrhea: NEGATIVE

## 2023-08-19 LAB — RPR: RPR Ser Ql: NONREACTIVE
